# Patient Record
Sex: Female | Born: 1965 | Race: White | Hispanic: No | Marital: Married | Smoking: Former smoker
Health system: Southern US, Community
[De-identification: ages and names within clinical notes are randomized; demographics above are authoritative.]

## PROBLEM LIST (undated history)

## (undated) DIAGNOSIS — E785 Hyperlipidemia, unspecified: Secondary | ICD-10-CM

## (undated) DIAGNOSIS — M8580 Other specified disorders of bone density and structure, unspecified site: Secondary | ICD-10-CM

## (undated) DIAGNOSIS — K824 Cholesterolosis of gallbladder: Secondary | ICD-10-CM

## (undated) DIAGNOSIS — J309 Allergic rhinitis, unspecified: Secondary | ICD-10-CM

## (undated) DIAGNOSIS — E559 Vitamin D deficiency, unspecified: Secondary | ICD-10-CM

## (undated) HISTORY — DX: Cholesterolosis of gallbladder: K82.4

## (undated) HISTORY — DX: Other specified disorders of bone density and structure, unspecified site: M85.80

## (undated) HISTORY — PX: COLONOSCOPY: SHX174

## (undated) HISTORY — PX: WISDOM TOOTH EXTRACTION: SHX21

## (undated) HISTORY — DX: Allergic rhinitis, unspecified: J30.9

## (undated) HISTORY — PX: SKIN SURGERY: SHX2413

## (undated) HISTORY — DX: Hyperlipidemia, unspecified: E78.5

## (undated) HISTORY — DX: Vitamin D deficiency, unspecified: E55.9

---

## 1999-03-10 ENCOUNTER — Other Ambulatory Visit: Admission: RE | Admit: 1999-03-10 | Discharge: 1999-03-10 | Payer: Self-pay | Admitting: Obstetrics & Gynecology

## 2000-04-06 ENCOUNTER — Other Ambulatory Visit: Admission: RE | Admit: 2000-04-06 | Discharge: 2000-04-06 | Payer: Self-pay | Admitting: Obstetrics & Gynecology

## 2000-04-13 ENCOUNTER — Encounter: Payer: Self-pay | Admitting: Obstetrics & Gynecology

## 2000-04-13 ENCOUNTER — Ambulatory Visit (HOSPITAL_COMMUNITY): Admission: RE | Admit: 2000-04-13 | Discharge: 2000-04-13 | Payer: 59 | Admitting: Obstetrics & Gynecology

## 2001-04-28 ENCOUNTER — Other Ambulatory Visit: Admission: RE | Admit: 2001-04-28 | Discharge: 2001-04-28 | Payer: Self-pay | Admitting: Obstetrics & Gynecology

## 2002-10-12 ENCOUNTER — Other Ambulatory Visit: Admission: RE | Admit: 2002-10-12 | Discharge: 2002-10-12 | Payer: Self-pay | Admitting: Obstetrics & Gynecology

## 2004-03-26 ENCOUNTER — Other Ambulatory Visit: Admission: RE | Admit: 2004-03-26 | Discharge: 2004-03-26 | Payer: Self-pay | Admitting: Obstetrics & Gynecology

## 2004-06-20 ENCOUNTER — Encounter (INDEPENDENT_AMBULATORY_CARE_PROVIDER_SITE_OTHER): Payer: Self-pay | Admitting: Specialist

## 2004-06-20 ENCOUNTER — Ambulatory Visit (HOSPITAL_COMMUNITY): Admission: RE | Admit: 2004-06-20 | Discharge: 2004-06-20 | Payer: Self-pay | Admitting: Obstetrics & Gynecology

## 2005-04-27 HISTORY — PX: NOVASURE ABLATION: SHX5394

## 2005-05-12 ENCOUNTER — Other Ambulatory Visit: Admission: RE | Admit: 2005-05-12 | Discharge: 2005-05-12 | Payer: Self-pay | Admitting: Obstetrics & Gynecology

## 2005-06-03 ENCOUNTER — Ambulatory Visit (HOSPITAL_COMMUNITY): Admission: RE | Admit: 2005-06-03 | Discharge: 2005-06-03 | Payer: Self-pay | Admitting: Obstetrics & Gynecology

## 2005-06-10 ENCOUNTER — Encounter: Admission: RE | Admit: 2005-06-10 | Discharge: 2005-06-10 | Payer: Self-pay | Admitting: Obstetrics & Gynecology

## 2005-12-09 ENCOUNTER — Encounter: Admission: RE | Admit: 2005-12-09 | Discharge: 2005-12-09 | Payer: Self-pay | Admitting: Obstetrics & Gynecology

## 2006-06-11 ENCOUNTER — Encounter: Admission: RE | Admit: 2006-06-11 | Discharge: 2006-06-11 | Payer: Self-pay | Admitting: Obstetrics & Gynecology

## 2006-06-25 ENCOUNTER — Encounter: Admission: RE | Admit: 2006-06-25 | Discharge: 2006-06-25 | Payer: Self-pay | Admitting: Obstetrics & Gynecology

## 2007-06-14 ENCOUNTER — Encounter: Admission: RE | Admit: 2007-06-14 | Discharge: 2007-06-14 | Payer: Self-pay | Admitting: Obstetrics & Gynecology

## 2008-07-06 ENCOUNTER — Encounter: Admission: RE | Admit: 2008-07-06 | Discharge: 2008-07-06 | Payer: Self-pay | Admitting: Obstetrics & Gynecology

## 2009-06-13 ENCOUNTER — Ambulatory Visit (HOSPITAL_COMMUNITY): Admission: RE | Admit: 2009-06-13 | Discharge: 2009-06-13 | Payer: Self-pay | Admitting: Internal Medicine

## 2010-04-23 ENCOUNTER — Encounter
Admission: RE | Admit: 2010-04-23 | Discharge: 2010-04-23 | Payer: Self-pay | Source: Home / Self Care | Attending: Obstetrics & Gynecology | Admitting: Obstetrics & Gynecology

## 2010-04-27 DIAGNOSIS — K824 Cholesterolosis of gallbladder: Secondary | ICD-10-CM

## 2010-04-27 HISTORY — DX: Cholesterolosis of gallbladder: K82.4

## 2010-05-18 ENCOUNTER — Encounter: Payer: Self-pay | Admitting: Obstetrics & Gynecology

## 2010-05-21 ENCOUNTER — Ambulatory Visit (HOSPITAL_COMMUNITY)
Admission: RE | Admit: 2010-05-21 | Discharge: 2010-05-21 | Payer: Self-pay | Source: Home / Self Care | Attending: Internal Medicine | Admitting: Internal Medicine

## 2010-09-12 NOTE — Op Note (Signed)
NAMECOOKIE, PORE NO.:  [id number]   MEDICAL RECORD NO.:  [id number]          PATIENT TYPE:  AMB   LOCATION:  SDC                           FACILITY:  [facility]   PHYSICIAN:  [name], M.D.   DATE OF BIRTH:  September 28, 1965   DATE OF PROCEDURE:  06/20/2004  DATE OF DISCHARGE:                                 OPERATIVE REPORT   PREOPERATIVE DIAGNOSIS:  Menorrhagia, no obvious uterine pathology except  for slight increased size of uterus.   POSTOPERATIVE DIAGNOSIS:  Menorrhagia, no obvious uterine pathology except  for slight increased size of uterus.   OPERATION:  Hysteroscopy, D&C, and NovaSure endometrial ablation.   SURGEON:  [name], M.D.   ANESTHESIA:  General.   ESTIMATED INTRAOPERATIVE BLOOD LOSS:  Less than 10 cc.   FLUIDS:  Lactated ringers 100 cc.   INTRAOPERATIVE COMPLICATIONS:  None.   The patient is a 45 year old with longstanding menorrhagia who is now  admitted for hysteroscopy D&C and NovaSure ablation.  Her preoperative  laboratory studies included a normal prothrombin time and PTT.  She was  admitted on the morning of surgery.  She was given a gram of Rocephin IV.  She was brought to the operating room, placed under adequate general  anesthesia, placed in the dorsolithotomy position using the [place] stirrup  system.  A Betadine prep of the perineum and vagina was carried out in the  usual fashion.  Sterile drapes were applied.  A bivalved speculum was  introduced and the cervix visualized, grasped on the anterior lip of the  single-tooth tenaculum.  A paracervical block was placed using 10 cc 1%  Xylocaine.  Injections were made at 4 and 8 o'clock in the vaginal fornices.  The uterus was sounded to 9-cm hertz to 2.5-cm.  The cervix was dilated with  Hegar dilators to 23.  A 12-1/2 degree [facility] hysteroscope was introduced  using lactated Ringers as a distending medium.  Inspection of the cavity  revealed no apparent abnormality.   General curettage was carried out and  endometrial sample submitted for histologic examination.  The NovaSure  system was then used in the standard fashion.  Cervical length was 2.5-cm.  Uterine sounding was 9-cm.  Cavity width was 4.7-cm.  The ablation was  accomplished without  difficulty.  Re inspection revealed complete ablation of the endometrial  cavity.  Photographs were made before and after the ablation.  They are  retained in the office record.  The procedure at that point was terminated.  The patient was given Toradol 30 mg IV, 30 mg IM.  She was awake and taken  to the recovery room in good condition.      WRN/MEDQ  D:  06/20/2004  T:  06/20/2004  Job:  [id number]

## 2011-11-27 ENCOUNTER — Other Ambulatory Visit (HOSPITAL_COMMUNITY): Payer: Self-pay | Admitting: Internal Medicine

## 2011-11-27 DIAGNOSIS — K824 Cholesterolosis of gallbladder: Secondary | ICD-10-CM

## 2012-01-15 ENCOUNTER — Ambulatory Visit (HOSPITAL_COMMUNITY): Payer: Self-pay

## 2012-02-26 ENCOUNTER — Ambulatory Visit (HOSPITAL_COMMUNITY): Payer: 59

## 2012-02-26 ENCOUNTER — Ambulatory Visit (HOSPITAL_COMMUNITY)
Admission: RE | Admit: 2012-02-26 | Discharge: 2012-02-26 | Disposition: A | Discharge status: Home / Self Care | Payer: 59 | Source: Ambulatory Visit | Attending: Internal Medicine | Admitting: Internal Medicine

## 2012-02-26 DIAGNOSIS — K824 Cholesterolosis of gallbladder: Secondary | ICD-10-CM | POA: Insufficient documentation

## 2012-08-16 ENCOUNTER — Other Ambulatory Visit: Payer: Self-pay

## 2012-08-16 DIAGNOSIS — Z1231 Encounter for screening mammogram for malignant neoplasm of breast: Secondary | ICD-10-CM

## 2012-09-09 ENCOUNTER — Ambulatory Visit
Admission: RE | Admit: 2012-09-09 | Discharge: 2012-09-09 | Disposition: A | Discharge status: Home / Self Care | Payer: 59 | Source: Ambulatory Visit

## 2012-09-09 DIAGNOSIS — Z1231 Encounter for screening mammogram for malignant neoplasm of breast: Secondary | ICD-10-CM

## 2013-05-01 DIAGNOSIS — Z0000 Encounter for general adult medical examination without abnormal findings: Secondary | ICD-10-CM

## 2013-05-07 ENCOUNTER — Other Ambulatory Visit: Payer: Self-pay | Admitting: Internal Medicine

## 2013-05-07 DIAGNOSIS — R112 Nausea with vomiting, unspecified: Secondary | ICD-10-CM

## 2013-05-07 MED ORDER — PROCHLORPERAZINE 25 MG RE SUPP: RECTAL | Status: DC

## 2013-11-02 ENCOUNTER — Other Ambulatory Visit: Payer: Self-pay | Admitting: Emergency Medicine

## 2013-11-02 MED ORDER — DOXYCYCLINE HYCLATE 100 MG PO TABS: 100.0000 mg | ORAL_TABLET | Freq: Two times a day (BID) | ORAL | Status: DC

## 2013-11-02 MED ORDER — MUPIROCIN CALCIUM 2 % EX CREA: TOPICAL_CREAM | CUTANEOUS | Status: DC

## 2013-11-06 ENCOUNTER — Encounter: Payer: Self-pay | Admitting: Emergency Medicine

## 2013-11-06 ENCOUNTER — Encounter (INDEPENDENT_AMBULATORY_CARE_PROVIDER_SITE_OTHER): Payer: Self-pay

## 2013-11-06 ENCOUNTER — Ambulatory Visit (INDEPENDENT_AMBULATORY_CARE_PROVIDER_SITE_OTHER): Payer: BC Managed Care – PPO | Admitting: Emergency Medicine

## 2013-11-06 VITALS — BP 118/64 | HR 60 | Temp 98.2°F | Resp 18 | Ht 65.0 in | Wt 129.0 lb

## 2013-11-06 DIAGNOSIS — L03317 Cellulitis of buttock: Principal | ICD-10-CM

## 2013-11-06 DIAGNOSIS — L0231 Cutaneous abscess of buttock: Secondary | ICD-10-CM

## 2013-11-06 NOTE — Progress Notes (Signed)
Subjective:    Patient ID: Yvonne Bell, female    DOB: 1965/06/21, 48 y.o.   MRN: [id number]  HPI Comments: 48 yo WF was started on DOXY 11/02/13 for ? MRSA with boil on right buttocks. She notes daughter has same type of boils on buttocks and was started on antibiotics. She has been doing the epsom salt soaks and Muporicin BID nasally since 11/02/13 AD. SHe notes lancing of area on Saturday with yellow exudate expressed has allowed more drainage to occur and has helped with pain. She notes today pain is significantly improved from Saturday and area has gone from quarter to dime size with hardness. She notes area is still mildly red but that has also improved. She along with daughter noticed areas after swimming in pool.  Unable to apply to a note for 11/04/13 due to inability to get scheduled when office closed.  Patient was seen in office on 11/04/13  By Loree Fee and  area was prepped with alcohol and # 10 blade used to make small incision into erythematous area on right buttock. Blood/ yellow exudate expressed. Patient tolerated procedure with out concern. Area was bandage with guaze.     Medication List       This list is accurate as of: 11/06/13  2:33 PM.  Always use your most recent med list.               doxycycline 100 MG tablet  Commonly known as:  VIBRA-TABS  Take 1 tablet (100 mg total) by mouth 2 (two) times daily.     mupirocin cream 2 %  Commonly known as:  BACTROBAN  Apply to inside of each nostril BID x 5 days     prochlorperazine 25 MG suppository  Commonly known as:  COMPAZINE  Remove foil wrapper and insert 1 suppository rectally every 6 to 8 hours as needed for severe nausea and vomiting       Allergies  Allergen Reactions  . Influenza Vaccines       Review of Systems  Skin: Positive for wound.  All other systems reviewed and are negative. BP 118/64  Pulse 60  Temp(Src) 98.2 F (36.8 C) (Temporal)  Resp 18  Ht 5\' 5"  (1.651 m)  Wt 129 lb (58.514  kg)  BMI 21.47 kg/m2      Objective:   Physical Exam  Nursing note and vitals reviewed. Constitutional: She is oriented to person, place, and time. She appears well-developed and well-nourished.  HENT:  Head: Normocephalic and atraumatic.  Eyes: Conjunctivae are normal.  Neck: Normal range of motion.  Cardiovascular: Normal rate, regular rhythm, normal heart sounds and intact distal pulses.   Pulmonary/Chest: Effort normal and breath sounds normal.  Abdominal: Soft. Bowel sounds are normal. She exhibits no distension. There is no tenderness.  Musculoskeletal: Normal range of motion.  Neurological: She is alert and oriented to person, place, and time.  Skin: Skin is warm and dry. There is erythema.  Psychiatric: She has a normal mood and affect. Judgment normal.          Assessment & Plan:  Abscess right buttock- Continue current plan an add Chlorine bath MRSA prophylaxis plan, call with results.

## 2013-11-20 ENCOUNTER — Ambulatory Visit (INDEPENDENT_AMBULATORY_CARE_PROVIDER_SITE_OTHER): Payer: BC Managed Care – PPO | Admitting: Physician Assistant

## 2013-11-20 ENCOUNTER — Encounter: Payer: Self-pay | Admitting: Physician Assistant

## 2013-11-20 VITALS — BP 110/68 | HR 84 | Temp 97.9°F | Resp 16 | Wt 128.0 lb

## 2013-11-20 DIAGNOSIS — L0231 Cutaneous abscess of buttock: Secondary | ICD-10-CM

## 2013-11-20 DIAGNOSIS — L03317 Cellulitis of buttock: Principal | ICD-10-CM

## 2013-11-20 MED ORDER — SULFAMETHOXAZOLE-TMP DS 800-160 MG PO TABS: 1.0000 | ORAL_TABLET | Freq: Two times a day (BID) | ORAL | Status: DC

## 2013-11-20 MED ORDER — FLUCONAZOLE 150 MG PO TABS: 150.0000 mg | ORAL_TABLET | Freq: Every day | ORAL | Status: DC

## 2013-11-20 NOTE — Progress Notes (Signed)
Subjective:    Patient ID: Yvonne Bell, female    DOB: 11-01-1965, 48 y.o.   MRN: [id number]  HPI 48 y.o. female with recent abscess, treated with doxycycline. Noticed bump and pain on Thursday on right buttocks. It has not gotten as large as the last one but she is afraid of it becoming larger as before. She has started new gym in the last month, denies any systemic symptoms.    Review of Systems  Constitutional: Negative.   HENT: Negative.   Respiratory: Negative.   Cardiovascular: Negative.   Gastrointestinal: Negative.   Genitourinary: Negative.   Musculoskeletal: Negative.   Skin: Positive for rash (eczema rash occ eyebrows, ears, and right buttocks) and wound (recurrent abscess ). Negative for color change and pallor.  Neurological: Negative.        Objective:   Physical Exam  Nursing note and vitals reviewed. Constitutional: She is oriented to person, place, and time. She appears well-developed and well-nourished.  HENT:  Head: Normocephalic and atraumatic.  Eyes: Conjunctivae are normal.  Neck: Normal range of motion.  Cardiovascular: Normal rate, regular rhythm, normal heart sounds and intact distal pulses.   Pulmonary/Chest: Effort normal and breath sounds normal.  Abdominal: Soft. Bowel sounds are normal. She exhibits no distension. There is no tenderness.  Musculoskeletal: Normal range of motion.  Neurological: She is alert and oriented to person, place, and time.  Skin: Skin is warm and dry. There is erythema (indurated, tender area on right medial buttocks, non fluctuant, 1.5x1.5cm).  Psychiatric: She has a normal mood and affect. Judgment normal.       Assessment & Plan:  Abscess- recurrent- bactrim, sitz bathes, warm wet compresses, exfoliate, shower after gym Eczema/dermatitis- can use baby shampoo, okay to use hydrocortisone OTC

## 2014-01-23 ENCOUNTER — Ambulatory Visit (INDEPENDENT_AMBULATORY_CARE_PROVIDER_SITE_OTHER): Payer: BC Managed Care – PPO | Admitting: Physician Assistant

## 2014-01-23 ENCOUNTER — Encounter: Payer: Self-pay | Admitting: Physician Assistant

## 2014-01-23 VITALS — BP 120/72 | HR 64 | Temp 97.7°F | Resp 16 | Ht 65.0 in | Wt 128.0 lb

## 2014-01-23 DIAGNOSIS — L0390 Cellulitis, unspecified: Principal | ICD-10-CM

## 2014-01-23 DIAGNOSIS — L0291 Cutaneous abscess, unspecified: Secondary | ICD-10-CM

## 2014-01-23 MED ORDER — SULFAMETHOXAZOLE-TMP DS 800-160 MG PO TABS: 1.0000 | ORAL_TABLET | Freq: Two times a day (BID) | ORAL | Status: DC

## 2014-01-23 NOTE — Progress Notes (Signed)
Subjective:    Patient ID: Yvonne Bell, female    DOB: May 27, 1965, 48 y.o.   MRN: [id number]  HPI 48 y.o. female with recent abscess, she was treated with bactrim and it went away when she noticed on her left buttocks a bump and tenderness. She took 1 of a left over ABX which helped some, she has not done compresses. Denies any systemic symptoms. She is very concerned as to why she is having the new abscesses, she uses a heating pad at night but turns it off before bed, she takes bubble bathes daily but has not changed soap. She is working out more and has started to have some hot flashes at night waking up.     Review of Systems  Constitutional: Negative.   HENT: Negative.   Respiratory: Negative.   Cardiovascular: Negative.   Gastrointestinal: Negative.   Genitourinary: Negative.   Musculoskeletal: Negative.   Skin: Positive for rash (left buttocks) and wound (recurrent abscess ). Negative for color change and pallor.  Neurological: Negative.        Objective:   Physical Exam  Nursing note and vitals reviewed. Constitutional: She is oriented to person, place, and time. She appears well-developed and well-nourished.  HENT:  Head: Normocephalic and atraumatic.  Eyes: Conjunctivae are normal.  Neck: Normal range of motion.  Cardiovascular: Normal rate, regular rhythm, normal heart sounds and intact distal pulses.   Pulmonary/Chest: Effort normal and breath sounds normal.  Abdominal: Soft. Bowel sounds are normal. She exhibits no distension. There is no tenderness.  Musculoskeletal: Normal range of motion.  Neurological: She is alert and oriented to person, place, and time.  Skin: Skin is warm and dry. There is erythema (indurated, tender area on left lateral buttocks, non fluctuant, 1x1cm).  Psychiatric: She has a normal mood and affect. Judgment normal.       Assessment & Plan:  Abscess- recurrent- bactrim, sitz bathes, warm wet compresses, exfoliate, shower after gym,  cotton undies.

## 2014-01-23 NOTE — Patient Instructions (Signed)

## 2014-04-06 ENCOUNTER — Encounter: Payer: Self-pay | Admitting: Emergency Medicine

## 2014-05-09 ENCOUNTER — Encounter: Payer: Self-pay | Admitting: Emergency Medicine

## 2014-05-15 ENCOUNTER — Ambulatory Visit (INDEPENDENT_AMBULATORY_CARE_PROVIDER_SITE_OTHER): Payer: BLUE CROSS/BLUE SHIELD | Admitting: Physician Assistant

## 2014-05-15 ENCOUNTER — Encounter: Payer: Self-pay | Admitting: Physician Assistant

## 2014-05-15 VITALS — BP 140/84 | HR 80 | Temp 98.4°F | Resp 18 | Ht 64.75 in

## 2014-05-15 DIAGNOSIS — L02419 Cutaneous abscess of limb, unspecified: Secondary | ICD-10-CM

## 2014-05-15 DIAGNOSIS — L03119 Cellulitis of unspecified part of limb: Secondary | ICD-10-CM

## 2014-05-15 MED ORDER — SULFAMETHOXAZOLE-TRIMETHOPRIM 800-160 MG PO TABS: 1.0000 | ORAL_TABLET | Freq: Two times a day (BID) | ORAL | Status: DC

## 2014-05-15 NOTE — Progress Notes (Signed)
  HPI  A Caucasian 49 y.o.female presents to the office today due to possible MRSA on right shin that started Saturday.  Her sxs are getting worse.  She took two pills of left over Bactrim from last cellulitis.  Patient was seen in July 2015 to September 2015 for cellulitis and abscess.  Patient's family has history of MRSA, but patient has never been tested herself.  She reports leg swelling and redness.  No pain.  She denies fever, chills sweats, fatigue, SOB, CP, abdominal pain ,nausea, vomiting, diarrhea, constipation, dizziness, lightheadedness and headaches.  Patient does get yeast infections from antibiotics and does have diflucan at home if needed.  Review of Systems  Constitutional: Negative.  Negative for fever, chills, malaise/fatigue and diaphoresis.  HENT: Negative.  Negative for congestion, ear discharge, ear pain and sore throat.   Eyes: Negative.   Respiratory: Negative.  Negative for cough, sputum production, shortness of breath and wheezing.   Cardiovascular: Negative.  Negative for chest pain.  Gastrointestinal: Negative.  Negative for nausea, vomiting, abdominal pain, diarrhea and constipation.  Genitourinary: Negative.  Negative for dysuria, urgency and frequency.  Musculoskeletal: Negative.   Skin:       Erythematous and edematous spot on right leg  Neurological: Negative.  Negative for headaches.   Past Medical History- No past medical history on file. Medications-  Current Outpatient Prescriptions on File Prior to Visit  Medication Sig Dispense Refill  . Cholecalciferol (VITAMIN D PO) Take 15,000 Int'l Units by mouth.    . sulfamethoxazole-trimethoprim (BACTRIM DS) 800-160 MG per tablet Take 1 tablet by mouth 2 (two) times daily. 20 tablet 0   No current facility-administered medications on file prior to visit.   Allergies-  Allergies  Allergen Reactions  . Influenza Vaccines    Physical Exam BP 140/84 mmHg  Pulse 80  Temp(Src) 98.4 F (36.9 C) (Temporal)   Resp 18  Ht 5' 4.75" (1.645 m) Wt Readings from Last 3 Encounters:  01/23/14 128 lb (58.06 kg)  11/20/13 128 lb (58.06 kg)  11/06/13 129 lb (58.514 kg)  Vitals Reviewed. General Appearance: Well nourished, in no apparent distress and had pleasant demeanor. Eyes:  PERRLA. EOMI. Conjunctiva is pink without edema, erythema or yellowing.  No scleral icterus. Neck: Normal range of motion. Respiratory: CTAB,  r/r/w or stridor. No increased effort of breathing. Cardio: RRR.   m/r/g.  S1S2nl.   Extremities:  C/C/E in upper and lower extremities. Pulses B/L +2  Musculoskeletal: Normal range of motion. Skin: Warm, dry, intact without rashes, lesions, ecchymosis, yellowing, cyanosis.  5cm erythematous, edematous, non-fluctuant and warm to touch spot on right proximal anterior medial tibia.  There is central scab on red spot, but no drainage to get culture.  Neuro: Alert and oriented X3, cooperative.  Mood and affect appropriate to situation.  CN II-XII grossly intact. Normal gait.   Psych: Insight and Judgment appropriate.   Assessment and Plan 1. Cellulitis and abscess of leg - sulfamethoxazole-trimethoprim (BACTRIM DS,SEPTRA DS) 800-160 MG per tablet; Take 1 tablet by mouth 2 (two) times daily. For 14 days  Dispense: 28 tablet; Refill: 0 Told patient to let me know if the swelling starts to come to a point.  Might have to I&D.  Discussed medication effects and SE's.  Pt agreed to treatment plan. Please follow up in 1 week.  Aswad Wandrey, Stephani Police, PA-C 4:43 PM [facility]

## 2014-05-15 NOTE — Patient Instructions (Addendum)
-Take Bactrim as prescribed with food. Please drink water to stay hydrated.  Please follow up in 1 week.   MRSA Infection MRSA stands for methicillin-resistant Staphylococcus aureus. This type of infection is caused by Staphylococcus aureus bacteria that are no longer affected by the medicines used to kill them (drug resistant). Staphylococcus (staph) bacteria are normally found on the skin or in the nose of healthy people. In most cases, these bacteria do not cause infection. But if these resistant bacteria enter your body through a cut or sore, they can cause a serious infection on your skin or in other parts of your body. There is a slight chance that the staph on your skin or in your nose is MRSA. There are two types of MRSA infections:  Hospital-acquired MRSA is bacteria that you get in the hospital.  Community-acquired MRSA is bacteria that you get somewhere other than in a hospital. RISK FACTORS Hospital-acquired MRSA is more common. You could be at risk for this infection if you are in the hospital and you:  Have surgery or a procedure.  Have an IV access or a catheter tube placed in your body.  Have weak resistance to germs (weakened immune system).  Are elderly.  Are on kidney dialysis. You could be at risk for community-acquired MRSA if you have a break in your skin and come into contact with MRSA. This may happen if you:  Play sports where there is skin-to-skin contact.  Live in a crowded setting, like a dormitory or a [employer]  Share towels, razors, or sports equipment with other people. SYMPTOMS  Symptoms of hospital-acquired MRSA depend on where MRSA has spread. Symptoms may include:  Wound infection.  Skin infection.  Rash.  Pneumonia.  Fever and chills.  Difficulty breathing.  Chest pain. Community-acquired MRSA is most likely to start as a scratch or cut that becomes infected. Symptoms may include:  A pus-filled pimple.  A boil on your  skin.  Pus draining from your skin.  A sore (abscess) under your skin or somewhere in your body.  Fever with or without chills. DIAGNOSIS  The diagnosis of MRSA is made by taking a sample from an infected area and sending it to a lab for testing. A lab technician can grow (culture) MRSA and check it under a microscope. The cultured MRSA can be tested to see which type of antibiotic medicine will work to treat it. Newer tests can identify MRSA more quickly by testing bacteria samples for MRSA genes. Your health care provider can diagnose MRSA using samples from:   Cuts or wounds in infected areas.  Nasal swabs.  Saliva or cough specimens from deep in the lungs (sputum).  Urine.  Blood. You may also have:  Imaging studies (such as X-ray or MRI) to check if the infection has spread to the lungs, bones, or joints.  A culture and sensitivity test of blood or fluids from inside the joints. TREATMENT  Treatment depends on how severe, deep, or extensive the infection is. Very bad infections may require a hospital stay.  Some skin infections, such as a small boil or sore (abscess), may be treated by draining pus from the site of the infection.  More extensive surgery to drain pus may be necessary for deeper or more widespread soft tissue infections.  You may then have to take antibiotic medicine given by mouth or through a vein. You may start antibiotic treatment right away or after testing can be done to see what  antibiotic medicine should be used. HOME CARE INSTRUCTIONS   Take your antibiotics as directed by your health care provider. Take the medicine as prescribed until it is finished.  Avoid close contact with those around you as much as possible. Do not use towels, razors, toothbrushes, bedding, or other items that will be used by others.  Wash your hands frequently for 15 seconds with soap and water. Dry your hands with a clean or disposable towel.  When you are not able to wash  your hands, use hand sanitizer that is more than 60 percent alcohol.  Wash towels, sheets, or clothes in the washing machine with detergent and hot water. Dry them in a hot dryer.  Follow your health care provider's instructions for wound care. Wash your hands before and after changing your bandages.  Always shower after exercising.  Keep all cuts and scrapes clean and covered with a bandage.  Be sure to tell all your health care providers that you have MRSA so they are aware of your infection. SEEK MEDICAL CARE IF:  You have a cut, scrape, pimple, or boil that becomes red, swollen, or painful or has pus in it.  You have pus draining from your skin.  You have an abscess under your skin or somewhere in your body. SEEK IMMEDIATE MEDICAL CARE IF:   You have symptoms of a skin infection with a fever or chills.  You have trouble breathing.  You have chest pain.  You have a skin wound and you become nauseous or start vomiting. MAKE SURE YOU:  Understand these instructions.  Will watch your condition.  Will get help right away if you are not doing well or get worse. Document Released: 04/13/2005 Document Revised: 04/18/2013 Document Reviewed: 02/03/2013 [facility] Patient Information 2015 [place], [place]. This information is not intended to replace advice given to you by your health care provider. Make sure you discuss any questions you have with your health care provider.

## 2014-05-22 ENCOUNTER — Encounter: Payer: Self-pay | Admitting: Physician Assistant

## 2014-05-22 ENCOUNTER — Ambulatory Visit (INDEPENDENT_AMBULATORY_CARE_PROVIDER_SITE_OTHER): Payer: BLUE CROSS/BLUE SHIELD | Admitting: Physician Assistant

## 2014-05-22 VITALS — BP 132/80 | HR 74 | Temp 98.2°F | Resp 18

## 2014-05-22 DIAGNOSIS — L03119 Cellulitis of unspecified part of limb: Secondary | ICD-10-CM

## 2014-05-22 DIAGNOSIS — L02419 Cutaneous abscess of limb, unspecified: Secondary | ICD-10-CM

## 2014-05-22 NOTE — Patient Instructions (Signed)
-  Continue Bactrim as prescribed. -Please change dressing every day.   Please use neosporin or Bacitracin over the counter.   Incision and Drainage Care After Refer to this sheet in the next few weeks. These instructions provide you with information on caring for yourself after your procedure. Your caregiver may also give you more specific instructions. Your treatment has been planned according to current medical practices, but problems sometimes occur. Call your caregiver if you have any problems or questions after your procedure. HOME CARE INSTRUCTIONS   If antibiotic medicine is given, take it as directed. Finish it even if you start to feel better.  Only take over-the-counter or prescription medicines for pain, discomfort, or fever as directed by your caregiver.  Keep all follow-up appointments as directed by your caregiver.  Change any bandages (dressings) as directed by your caregiver. Replace old dressings with clean dressings.  Wash your hands before and after caring for your wound. You will receive specific instructions for cleansing and caring for your wound.  SEEK MEDICAL CARE IF:   You have increased pain, swelling, or redness around the wound.  You have increased drainage, smell, or bleeding from the wound.  You have muscle aches, chills, or you feel generally sick.  You have a fever. MAKE SURE YOU:   Understand these instructions.  Will watch your condition.  Will get help right away if you are not doing well or get worse. Document Released: 07/06/2011 Document Reviewed: 07/06/2011 [facility] Patient Information 2015 [place]. This information is not intended to replace advice given to you by your health care provider. Make sure you discuss any questions you have with your health care provider.

## 2014-05-22 NOTE — Progress Notes (Signed)
HPI  A Caucasian 49 y.o.female presents to the office today due to follow up for cellulitis on right shin that started 05/12/14 (11 days ago).  Her sxs are getting better since being on Bactrim.  Patient is on day 8 of antibiotic out of 14 days.  Patient was seen in July 2015 to September 2015 for cellulitis and abscess.  Patient's family has history of MRSA, but patient has never been tested herself.  She is taking baths in tub, but is using Bleach to clean tub before each use.  She reports leg swelling and redness.  No pain.  She denies fever, chills sweats, fatigue, SOB, CP, abdominal pain ,nausea, vomiting, diarrhea, constipation, dizziness, lightheadedness and headaches.  Patient does get yeast infections from antibiotics and does have diflucan at home if needed.  Patient gave verbal consent for me to talk to Dr. Melford Aase if needed.   Review of Systems  All other systems reviewed and are negative.  Past Medical History- No past medical history on file.   Medications-  Current Outpatient Prescriptions on File Prior to Visit  Medication Sig Dispense Refill  . Cholecalciferol (VITAMIN D PO) Take 15,000 Int'l Units by mouth.    . sulfamethoxazole-trimethoprim (BACTRIM DS,SEPTRA DS) 800-160 MG per tablet Take 1 tablet by mouth 2 (two) times daily. For 14 days 28 tablet 0   No current facility-administered medications on file prior to visit.   Allergies-  Allergies  Allergen Reactions  . Influenza Vaccines    Physical Exam BP 132/80 mmHg  Pulse 74  Temp(Src) 98.2 F (36.8 C) (Temporal)  Resp 18 Wt Readings from Last 3 Encounters:  01/23/14 128 lb (58.06 kg)  11/20/13 128 lb (58.06 kg)  11/06/13 129 lb (58.514 kg)  Vitals Reviewed. General Appearance: Well nourished, in no apparent distress and had pleasant demeanor. Eyes:  PERRLA. EOMI. Conjunctiva is pink without edema, erythema or yellowing.  No scleral icterus. Neck: Normal range of motion. Respiratory: CTAB,  r/r/w or stridor.  No increased effort of breathing. Cardio: RRR.   m/r/g.  S1S2nl.   Extremities:  C/C/E in upper and lower extremities. Pulses B/L +2  Musculoskeletal: Normal range of motion. Skin: Warm, dry, intact without rashes, lesions, ecchymosis, yellowing, cyanosis.  3cm erythematous, edematous, fluctuant and not warm to touch spot on right proximal anterior medial tibia.  There is central scab on red spot, but no drainage.  Poked central fluctuant area with sterile needle and had purulent drainage, so then decided to I&D. Neuro: Alert and oriented X3, cooperative.  Mood and affect appropriate to situation.  CN II-XII grossly intact. Normal gait.   Psych: Insight and Judgment appropriate.   Incision and Drainage Procedure Note Pre-operative Diagnosis: Localized abscess of right proximal anterior medial tibia that is fluctuant, erythematous, painful, swollen and not resolving spontaneously with Bactrim. Post-operative Diagnosis: same Indications: Abscess (purulent drainage) and cellulitis. Anesthesia: Marcaine 0.5% with Epinephrine. Procedure Details  The procedure, risks and complications (cellulitis, recollection of pus, bacteremia, septicemia) have been discussed in detail (including, but not limited to airway compromise, infection, bleeding, scarring) with the patient, and the patient gave verbal consent to the procedure.  The skin was sterilely prepped and draped with chucks around the affected area in the usual fashion. After adequate local anesthesia, I&D with a #11 blade was performed on the right, medial proximal anterior tibia.  Incision was 1cm long.  Purulent drainage: present.  Wound culture obtained. The patient was observed until stable. Findings: Purulent material- liquid and solid  EBL: 2-3 cc's Drains: None Dressing: No packing used.  Two 4x4 gauze pads were folded twice and placed over incision site with Bacitracin ointment.  Then one Ace bandage was placed on wound to hold gauze pads  in place.  Condition: Tolerated procedure well and Stable Complications: none.  Assessment and Plan 1. Cellulitis and abscess of leg Continue Bactrim as prescribed. Will call patient with wound culture results. Apply hot compresses frequently to promote drainage. Please change dressing daily and gave patient aftercare instructions.  Discussed medication effects and SE's.  Pt agreed to treatment plan. Please follow up in 2 days for recheck.  Phu Record, Stephani Police, PA-C 4:29 PM [facility]

## 2014-05-24 ENCOUNTER — Encounter: Payer: Self-pay | Admitting: Physician Assistant

## 2014-05-24 ENCOUNTER — Ambulatory Visit: Payer: Self-pay | Admitting: Physician Assistant

## 2014-05-24 DIAGNOSIS — L02419 Cutaneous abscess of limb, unspecified: Secondary | ICD-10-CM

## 2014-05-24 DIAGNOSIS — L03119 Cellulitis of unspecified part of limb: Principal | ICD-10-CM

## 2014-05-24 NOTE — Progress Notes (Signed)
  HPI  A Caucasian 49 y.o.female presents to the office today due to follow up for cellulitis on right shin that started 05/12/14 (13 days ago).  Her sxs are getting better since being on Bactrim.  Patient is on day 10 of antibiotic out of 14 days.  Patient had I&D done 2 days ago without complications.  Patient is cleaning the site with soap and water during shower, and then covering with large band-aid with neosporin.  Still waiting for wound culture results that were obtained during I&D procedure.  Patient was seen in July 2015 to September 2015 for cellulitis and abscess.  Patient's family has history of MRSA, but patient has never been tested herself.  She is taking baths in tub, but is using Bleach to clean tub before each use.  She reports leg swelling and redness.  No pain.  She denies fever, chills sweats, fatigue, SOB, CP, abdominal pain ,nausea, vomiting, diarrhea, constipation, dizziness, lightheadedness and headaches.  Patient does get yeast infections from antibiotics and does have diflucan at home if needed.  Patient gave verbal consent for me to talk to Dr. Melford Aase if needed.   Review of Systems  All other systems reviewed and are negative.  Past Medical History- No past medical history on file.   Medications-  Current Outpatient Prescriptions on File Prior to Visit  Medication Sig Dispense Refill  . Cholecalciferol (VITAMIN D PO) Take 15,000 Int'l Units by mouth.    . sulfamethoxazole-trimethoprim (BACTRIM DS,SEPTRA DS) 800-160 MG per tablet Take 1 tablet by mouth 2 (two) times daily. For 14 days 28 tablet 0   No current facility-administered medications on file prior to visit.   Allergies-  Allergies  Allergen Reactions  . Influenza Vaccines    Physical Exam There were no vitals taken for this visit. Wt Readings from Last 3 Encounters:  01/23/14 128 lb (58.06 kg)  11/20/13 128 lb (58.06 kg)  11/06/13 129 lb (58.514 kg)  Vitals Reviewed. General Appearance: Well  nourished, in no apparent distress and had pleasant demeanor. Extremities:  C/C/E in upper and lower extremities. Pulses B/L +2  Musculoskeletal: Normal range of motion. Skin: Warm, dry, intact without rashes, ecchymosis, yellowing, cyanosis.  3cm erythematous, edematous, non-fluctuant and not warm to touch cellulitis with 0.5cm horizontal lesion on right proximal anterior medial tibia.  There was small amount of purulent drainage.   Neuro: Alert and oriented X3, cooperative.  Mood and affect appropriate to situation.  CN II-XII grossly intact. Normal gait.   Psych: Insight and Judgment appropriate.   Assessment and Plan 1. Cellulitis and abscess of leg Continue Bactrim as prescribed. Will call patient with wound culture results. Apply hot compresses frequently to promote drainage. Please change dressing daily and gave patient aftercare instructions (can shower, but no baths).  Discussed medication effects and SE's.  Pt agreed to treatment plan. Please follow up in 4 days for recheck.  Princess Karnes, Stephani Police, PA-C 8:25 PM [facility]

## 2014-05-25 LAB — WOUND CULTURE
GRAM STAIN: NONE SEEN
Gram Stain: NONE SEEN

## 2014-05-28 ENCOUNTER — Other Ambulatory Visit: Payer: Self-pay | Admitting: Internal Medicine

## 2014-05-28 MED ORDER — LEVOFLOXACIN 500 MG PO TABS: ORAL_TABLET | ORAL | Status: DC

## 2014-05-28 NOTE — Progress Notes (Signed)
S:   Patient is seen in f/u today completing a course of Septra for a MERSA (+) abscess of right shin and has some persistent tenderness around the wound site.  O:   ~ 10 mm superficial ulceration with surrounding erythema w/o induration of fluctuance or satellite lesions or the right shin. No signs of lymphangitis.  A:   s/p I&D MERSA abscess of rt shin  P:   per sensitivities will renew with Levaquin 50 mg  #20 - 1 bid x 5 days, then qd x 10 days.

## 2014-06-08 ENCOUNTER — Ambulatory Visit (INDEPENDENT_AMBULATORY_CARE_PROVIDER_SITE_OTHER): Payer: BLUE CROSS/BLUE SHIELD | Admitting: Emergency Medicine

## 2014-06-08 ENCOUNTER — Encounter: Payer: Self-pay | Admitting: Emergency Medicine

## 2014-06-08 VITALS — BP 134/70 | HR 60 | Temp 98.2°F | Resp 18 | Ht 64.5 in | Wt 130.0 lb

## 2014-06-08 DIAGNOSIS — Z79899 Other long term (current) drug therapy: Secondary | ICD-10-CM

## 2014-06-08 DIAGNOSIS — Z1212 Encounter for screening for malignant neoplasm of rectum: Secondary | ICD-10-CM

## 2014-06-08 DIAGNOSIS — M8580 Other specified disorders of bone density and structure, unspecified site: Secondary | ICD-10-CM

## 2014-06-08 DIAGNOSIS — Z0000 Encounter for general adult medical examination without abnormal findings: Secondary | ICD-10-CM

## 2014-06-08 DIAGNOSIS — R239 Unspecified skin changes: Secondary | ICD-10-CM

## 2014-06-08 DIAGNOSIS — R05 Cough: Secondary | ICD-10-CM

## 2014-06-08 DIAGNOSIS — E782 Mixed hyperlipidemia: Secondary | ICD-10-CM

## 2014-06-08 DIAGNOSIS — R932 Abnormal findings on diagnostic imaging of liver and biliary tract: Secondary | ICD-10-CM

## 2014-06-08 DIAGNOSIS — Z0001 Encounter for general adult medical examination with abnormal findings: Secondary | ICD-10-CM

## 2014-06-08 DIAGNOSIS — Z111 Encounter for screening for respiratory tuberculosis: Secondary | ICD-10-CM

## 2014-06-08 DIAGNOSIS — R6889 Other general symptoms and signs: Secondary | ICD-10-CM

## 2014-06-08 DIAGNOSIS — I10 Essential (primary) hypertension: Secondary | ICD-10-CM

## 2014-06-08 DIAGNOSIS — R059 Cough, unspecified: Secondary | ICD-10-CM

## 2014-06-08 DIAGNOSIS — E559 Vitamin D deficiency, unspecified: Secondary | ICD-10-CM

## 2014-06-08 LAB — CBC WITH DIFFERENTIAL/PLATELET
BASOS PCT: 1 % (ref 0–1)
Basophils Absolute: 0.1 10*3/uL (ref 0.0–0.1)
EOS ABS: 0.1 10*3/uL (ref 0.0–0.7)
Eosinophils Relative: 2 % (ref 0–5)
HCT: 45.2 % (ref 36.0–46.0)
HEMOGLOBIN: 15.2 g/dL — AB (ref 12.0–15.0)
LYMPHS ABS: 1.7 10*3/uL (ref 0.7–4.0)
Lymphocytes Relative: 23 % (ref 12–46)
MCH: 32.1 pg (ref 26.0–34.0)
MCHC: 33.6 g/dL (ref 30.0–36.0)
MCV: 95.6 fL (ref 78.0–100.0)
MPV: 10.1 fL (ref 8.6–12.4)
Monocytes Absolute: 0.7 10*3/uL (ref 0.1–1.0)
Monocytes Relative: 10 % (ref 3–12)
NEUTROS PCT: 64 % (ref 43–77)
Neutro Abs: 4.7 10*3/uL (ref 1.7–7.7)
Platelets: 311 10*3/uL (ref 150–400)
RBC: 4.73 MIL/uL (ref 3.87–5.11)
RDW: 13.6 % (ref 11.5–15.5)
WBC: 7.3 10*3/uL (ref 4.0–10.5)

## 2014-06-08 MED ORDER — MUPIROCIN CALCIUM 2 % EX CREA: TOPICAL_CREAM | CUTANEOUS | Status: DC

## 2014-06-08 NOTE — Patient Instructions (Addendum)
Melanoma Melanoma is a form of skin cancer that begins in melanocytes. Melanocytes are the skin cells that produce pigment. Melanoma starts as a mole on the skin and can spread to other parts of the body. If found early, many cases of melanoma are curable.  CAUSES  The exact cause is unknown.  RISK FACTORS  Spending a lot of time in the sun, under a sunlamp, or in a tanning booth.  Having sunburn that blisters. The more blistering sunburns you have, the higher your risk of melanoma.  Spending time in parts of the world with more intense sunlight.  Living in a hot, sunny climate.  Having fair skin that does not tan easily.  Having had melanoma before.  Having a family history of melanoma.  Having more than 100 skin moles. SIGNS AND SYMPTOMS   ABCDE changes in a mole. ABCDE stands for:   Asymmetry. This means the mole has an irregular shape. It is not round or oval.  Border. This means the mole has an irregular or bumpy border.  Color. This means the mole has multiple colors in it, including brown, black, blue, red, or tan.  Diameter. This means the mole is more than 0.2 in (6 mm) across.  Evolving. This refers to any unusual changes or symptoms in the mole, such as pain, itching, stinging, sensitivity, or bleeding.  A new mole.  Swollen lymph nodes.  Shortness of breath.  Bone pain.  Headache.  Seizures.  Visual problems. DIAGNOSIS  Your health care provider will take a tissue sample from the mole to examine under a microscope (biopsy). The biopsy will reveal whether melanoma has spread to deeper layers of the skin. Your health care provider will also order tests, including:  Blood tests.  Chest X-rays.  A CT scan.  A bone scan. TREATMENT  You will have surgery to remove the cancer. Your lymph nodes may also be removed during the surgery. If melanoma has spread to other organs, such as the liver, lungs, bone, or brain, you will need additional treatment.   PREVENTION  Melanoma may come back (recur) after it has been treated. Your risk of recurrence is higher if you had thick or ulcerated tumors or patches of tumors. Generally, the more advanced your melanoma, the more likely it will recur. You can help prevent melanoma from recurring by staying out of the sun, especially during peak midafternoon hours. When you are outdoors or in the sun:  Wear long sleeves, a hat, and sunglasses that block UV light when possible.  Apply a sunscreen with an SPF of 30 or higher regularly. Take these precautions on cloudy days and in the winter, even if you will be outdoors for only a short period of time. SEEK MEDICAL CARE IF:  You notice any new growths or changes in your skin.  You have had melanoma removed and you notice a new growth in the same location. Document Released: 04/13/2005 Document Revised: 08/28/2013 Document Reviewed: 06/07/2013 [facility] Patient Information 2015 [place], [place]. This information is not intended to replace advice given to you by your health care provider. Make sure you discuss any questions you have with your health care provider.  Tuberculin Skin Test The PPD skin test is a method used to help with the diagnosis of a disease called tuberculosis (TB). HOW THE TEST IS DONE  The test site (usually the forearm) is cleansed. The PPD extract is then injected under the top layer of skin, causing a blister to form on  the skin. The reaction will take 48 - 72 hours to develop. You must return to your health care provider within that time to have the area checked. This will determine whether you have had a significant reaction to the PPD test. A reaction is measured in millimeters of hard swelling (induration) at the site. PREPARATION FOR TEST  There is no special preparation for this test. People with a skin rash or other skin irritations on their arms may need to have the test performed at a different spot on the body. Tell your health  care provider if you have ever had a positive PPD skin test. If so, you should not have a repeat PPD test. Tell your doctor if you have a medical condition or if you take certain drugs, such as steroids, that can affect your immune system. These situations may lead to inaccurate test results. NORMAL FINDINGS A negative reaction (no induration) or a level of hard swelling that falls below a certain cutoff may mean that a person has not been infected with the bacteria that cause TB. There are different cutoffs for children, people with HIV, and other risk groups. Unfortunately, this is not a perfect test, and up to 20% of people infected with tuberculosis may not have a reaction on the PPD skin test. In addition, certain conditions that affect the immune system (cancer, recent chemotherapy, late-stage AIDS) may cause a false-negative test result.  The reaction will take 48 - 72 hours to develop. You must return to your health care provider within that time to have the area checked. Follow your caregiver's instructions as to where and when to report for this to be done. Ranges for normal findings may vary among different laboratories and hospitals. You should always check with your doctor after having lab work or other tests done to discuss the meaning of your test results and whether your values are considered within normal limits. WHAT ABNORMAL RESULTS MEAN  The results of the test depend on the size of the skin reaction and on the person being tested.  A small reaction (5 mm of hard swelling at the site) is considered to be positive in people who have HIV, who are taking steroid therapy, or who have been in close contact with a person who has active tuberculosis. Larger reactions (greater than or equal to 10 mm) are considered positive in people with diabetes or kidney failure, and in health care workers, among others. In people with no known risks for tuberculosis, a positive reaction requires 15 mm or  more of hard swelling at the site. RISKS AND COMPLICATIONS There is a very small risk of severe redness and swelling of the arm in people who have had a previous positive PPD test and who have the test again. There also have been a few rare cases of this reaction in people who have not been tested before. CONSIDERATIONS  A positive skin test does not necessarily mean that a person has active tuberculosis. More tests will be done to check whether active disease is present. Many people who were born outside the [place] may have had a vaccine called "BCG," which can lead to a false-positive test result. MEANING OF TEST  Your caregiver will go over the test results with you and discuss the importance and meaning of your results, as well as treatment options and the need for additional tests if necessary. OBTAINING THE TEST RESULTS It is your responsibility to obtain your test results. Ask the lab  or department performing the test when and how you will get your results. Document Released: 01/21/2005 Document Revised: 07/06/2011 Document Reviewed: 07/21/2013 [facility] Patient Information 2015 [place], [place]. This information is not intended to replace advice given to you by your health care provider. Make sure you discuss any questions you have with your health care provider.

## 2014-06-09 LAB — GAMMA GT: GGT: 37 U/L (ref 7–51)

## 2014-06-09 LAB — HEMOGLOBIN A1C
Hgb A1c MFr Bld: 5.2 % (ref <5.7)
Mean Plasma Glucose: 103 mg/dL (ref <117)

## 2014-06-09 LAB — LIPID PANEL
Cholesterol: 226 mg/dL — ABNORMAL HIGH (ref 0–200)
HDL: 95 mg/dL (ref >39)
LDL CALC: 119 mg/dL — AB (ref 0–99)
TRIGLYCERIDES: 61 mg/dL (ref <150)
Total CHOL/HDL Ratio: 2.4 Ratio
VLDL: 12 mg/dL (ref 0–40)

## 2014-06-09 LAB — URINALYSIS, ROUTINE W REFLEX MICROSCOPIC
BILIRUBIN URINE: NEGATIVE
Glucose, UA: NEGATIVE mg/dL
Ketones, ur: NEGATIVE mg/dL
LEUKOCYTES UA: NEGATIVE
Nitrite: NEGATIVE
PROTEIN: NEGATIVE mg/dL
Urobilinogen, UA: 0.2 mg/dL (ref 0.0–1.0)
pH: 7 (ref 5.0–8.0)

## 2014-06-09 LAB — URINALYSIS, MICROSCOPIC ONLY
Bacteria, UA: NONE SEEN
CASTS: NONE SEEN
CRYSTALS: NONE SEEN
Squamous Epithelial / LPF: NONE SEEN

## 2014-06-09 LAB — BASIC METABOLIC PANEL WITH GFR
BUN: 10 mg/dL (ref 6–23)
CHLORIDE: 100 meq/L (ref 96–112)
CO2: 26 meq/L (ref 19–32)
Calcium: 9.7 mg/dL (ref 8.4–10.5)
Creat: 0.76 mg/dL (ref 0.50–1.10)
GFR, Est African American: >89 mL/min
Glucose, Bld: 84 mg/dL (ref 70–99)
POTASSIUM: 4.8 meq/L (ref 3.5–5.3)
Sodium: 139 mEq/L (ref 135–145)

## 2014-06-09 LAB — MICROALBUMIN / CREATININE URINE RATIO: Creatinine, Urine: 10.3 mg/dL

## 2014-06-09 LAB — VITAMIN D 25 HYDROXY (VIT D DEFICIENCY, FRACTURES): VIT D 25 HYDROXY: 86 ng/mL (ref 30–100)

## 2014-06-09 LAB — MAGNESIUM: MAGNESIUM: 2.1 mg/dL (ref 1.5–2.5)

## 2014-06-09 LAB — HEPATIC FUNCTION PANEL
ALBUMIN: 4.7 g/dL (ref 3.5–5.2)
ALT: 26 U/L (ref 0–35)
AST: 20 U/L (ref 0–37)
Alkaline Phosphatase: 73 U/L (ref 39–117)
BILIRUBIN DIRECT: 0.1 mg/dL (ref 0.0–0.3)
BILIRUBIN TOTAL: 0.4 mg/dL (ref 0.2–1.2)
Indirect Bilirubin: 0.3 mg/dL (ref 0.2–1.2)
Total Protein: 7.4 g/dL (ref 6.0–8.3)

## 2014-06-09 LAB — TSH: TSH: 2.516 u[IU]/mL (ref 0.350–4.500)

## 2014-06-09 LAB — INSULIN, FASTING: INSULIN FASTING, SERUM: 3.2 u[IU]/mL (ref 2.0–19.6)

## 2014-06-11 ENCOUNTER — Encounter: Payer: Self-pay | Admitting: Emergency Medicine

## 2014-06-11 DIAGNOSIS — J309 Allergic rhinitis, unspecified: Secondary | ICD-10-CM | POA: Insufficient documentation

## 2014-06-11 DIAGNOSIS — E559 Vitamin D deficiency, unspecified: Secondary | ICD-10-CM | POA: Insufficient documentation

## 2014-06-11 DIAGNOSIS — K824 Cholesterolosis of gallbladder: Secondary | ICD-10-CM | POA: Insufficient documentation

## 2014-06-11 DIAGNOSIS — M8580 Other specified disorders of bone density and structure, unspecified site: Secondary | ICD-10-CM | POA: Insufficient documentation

## 2014-06-11 NOTE — Progress Notes (Signed)
Subjective:    Patient ID: Yvonne Bell, female    DOB: 04/07/1966, 49 y.o.   MRN: [id number]  HPI Comments: 49 yo WF CPE and OVERDUE Cholesterol recheck. She is trying to eat better and exercises routinely. She prefer no cholesterol RX.   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 SHe is currently on Levaquin and has had positive improvement with wound. She has completed MRSA chlorine baths/ Mupirocin treatment in past. She notes her husband and daughter have also had MRSA in last year.   She is due for U/S recheck of GB polyp that was stable in 2014 originally diagnosed in 2012. She has had NEG GGT labs and denies abdominal symptoms.  She is due for BMD with osteopenia history and denies fractures. She is overdue for Mammo but will schedule exam.   She did not f/u at [facility] as advised for mole removal and full skin check AD in 2014. She has FHx of Melanoma. She has history of Atypical nevi. She denies skin changes.   LAST Abnormal LABS 01/2013 T 211 LDL 119 ALT 42 VIT D 120     Medication List       This list is accurate as of: 06/08/14 11:59 PM.  Always use your most recent med list.               levofloxacin 500 MG tablet  Commonly known as:  LEVAQUIN  Take 1 tablet 2 x day with food for 5 days, then 1 tablet daily with food for infection     mupirocin cream 2 %  Commonly known as:  BACTROBAN  Apply to affected area 3 times daily     VITAMIN D PO  Take 15,000 Int'l Units by mouth.       Allergies  Allergen Reactions  . Influenza Vaccines    Past Medical History  Diagnosis Date  . Hyperlipidemia   . Osteopenia   . Allergic rhinitis   . Vitamin D deficiency   . Gall bladder polyp 2012   Past Surgical History  Procedure Laterality Date  . Cesarean section    . Novasure ablation  2007  . Wisdom tooth extraction    . Skin surgery Left     Moderate atypia, abdomen   History  Substance Use Topics  .  Smoking status: Former Games developer  . Smokeless tobacco: Former Neurosurgeon    Quit date: 11/06/1992  . Alcohol Use: Yes   Family History  Problem Relation Age of Onset  . Hyperlipidemia Mother   . Hypertension Mother   . Osteopenia Mother   . Cancer Father 23    Melanoma with lung METS  . Colon polyps Sister   . Cancer Maternal Aunt     colon  . Cancer Paternal Uncle     lung  . Stroke Maternal Grandmother   . Aneurysm Maternal Grandmother   . Hyperlipidemia Maternal Grandfather   . Heart disease Maternal Grandfather      MAINTENANCE: Colonoscopy:n/a Mammo:09/09/2012 OVERDUE BMD:09/20/12 osteopenia AT GYN Pap/ Pelvic:09/20/12 WNL WUJ:[id number] 04/2014 WNL Dentist:Q 6 month CXR: OVER DUE with tobacco  IMMUNIZATIONS: Tdap:2010 Pneumovax:declines Zostavax:n/a Influenza:Allergic reaction 2 years in a row  Patient Care Team: Lucky Cowboy, MD as PCP - General (Internal Medicine) Lacretia Nicks Varney Baas, MD as Consulting Physician (Obstetrics and Gynecology)  Hyacinth Meeker, [facility]) Desmond Lope, (Dentist)  Review of Systems  Constitutional: Negative for fatigue.  Respiratory: Negative for cough and shortness of  breath.   Cardiovascular: Negative for chest pain.  Skin: Positive for wound.  All other systems reviewed and are negative.  BP 134/70 mmHg  Pulse 60  Temp(Src) 98.2 F (36.8 C) (Temporal)  Resp 18  Ht 5' 4.5" (1.638 m)  Wt 130 lb (58.968 kg)  BMI 21.98 kg/m2     Objective:   Physical Exam  Constitutional: She is oriented to person, place, and time. She appears well-developed and well-nourished. No distress.  HENT:  Head: Normocephalic.  Nose: Nose normal.  Mouth/Throat: Oropharynx is clear and moist.  Eyes: Conjunctivae and EOM are normal. Pupils are equal, round, and reactive to light. No scleral icterus.  Neck: Normal range of motion. Neck supple. No JVD present. No tracheal deviation present. No thyromegaly present.  Cardiovascular: Normal rate, regular rhythm, normal heart  sounds and intact distal pulses.   Pulmonary/Chest: Effort normal and breath sounds normal.  Abdominal: Soft. Bowel sounds are normal. She exhibits no distension and no mass. There is no tenderness.  Genitourinary:  Def GYN  Musculoskeletal: Normal range of motion. She exhibits no edema or tenderness.  Lymphadenopathy:    She has no cervical adenopathy.  Neurological: She is alert and oriented to person, place, and time. She has normal reflexes. No cranial nerve deficit. She exhibits normal muscle tone. Coordination normal.  Skin: Skin is warm and dry. No rash noted. No erythema.     Psychiatric: She has a normal mood and affect. Her behavior is normal. Judgment and thought content normal.  Nursing note and vitals reviewed.    EKG NSCSPT WNL      Assessment & Plan:  1. CPE- Update screening labs/ History/ Immunizations/ Testing as needed. Advised healthy diet, QD exercise, increase H20 and continue RX/ Vitamins AD. GET CXR updated with past tobacco HX  2. Cholesterol NONCOMPLIANT with follow up- recheck labs, Need to eat healthier and exercise AD.  3. Recurrent MRSA skin infections-repeat MRSA treatment with bleach baths/ mupirocin with entire family AD  4. Irreg ATYPICAL Nevi- needs removal ASAP, Patient did not f/u for DERM removal AD in 2013/2014. Advised of concerns for Melanoma with increased pigment and FHX Melanoma in Father  5. GB Polyp- Recheck labs and Ultrasound, 2 years last scan stable

## 2014-06-11 NOTE — Progress Notes (Deleted)
Subjective:    Patient ID: Yvonne Bell, female    DOB: 1965/12/24, 49 y.o.   MRN: [id number]  HPI Comments: Right proximal tib/fib MRSA healing. \  She has had stable GB polyp and GGT labs but is due for recheck     Medication List       This list is accurate as of: 06/08/14 11:59 PM.  Always use your most recent med list.               levofloxacin 500 MG tablet  Commonly known as:  LEVAQUIN  Take 1 tablet 2 x day with food for 5 days, then 1 tablet daily with food for infection     mupirocin cream 2 %  Commonly known as:  BACTROBAN  Apply to affected area 3 times daily     VITAMIN D PO  Take 15,000 Int'l Units by mouth.       Allergies  Allergen Reactions  . Influenza Vaccines    Past Medical History  Diagnosis Date  . Hyperlipidemia   . Osteopenia   . Allergic rhinitis   . Vitamin D deficiency   . Gall bladder polyp 2012    Past Surgical History  Procedure Laterality Date  . Cesarean section    . Novasure ablation  2007  . Wisdom tooth extraction    . Skin surgery Left     Moderate atypia, abdomen     History  Substance Use Topics  . Smoking status: Former Games developer  . Smokeless tobacco: Former Neurosurgeon    Quit date: 11/06/1992  . Alcohol Use: Yes   Family History  Problem Relation Age of Onset  . Hyperlipidemia Mother   . Hypertension Mother   . Osteopenia Mother   . Cancer Father 67    Melanoma with lung METS  . Colon polyps Sister   . Cancer Maternal Aunt     colon  . Cancer Paternal Uncle     lung  . Stroke Maternal Grandmother   . Aneurysm Maternal Grandmother   . Hyperlipidemia Maternal Grandfather   . Heart disease Maternal Grandfather      MAINTENANCE: Colonoscopy: Mammo:09/09/2012 OVERDUE BMD:09/20/12 osteopenia AT GYN Pap/ Pelvic:09/20/12 WNL [id number]:[id number] 04/2014 WNL Dentist:Q 6 month CXR: OVER DUE with tobacco  IMMUNIZATIONS: Tdap:2010 Pneumovax:declines Zostavax:n/a Influenza:Allergic reaction 2 years in a  row  Patient Care Team: Lucky Cowboy, MD as PCP - General (Internal Medicine)   Review of Systems BP 134/70 mmHg  Pulse 60  Temp(Src) 98.2 F (36.8 C) (Temporal)  Resp 18  Ht 5' 4.5" (1.638 m)  Wt 130 lb (58.968 kg)  BMI 21.98 kg/m2     Objective:   Physical Exam        Assessment & Plan:  1. CPE- Update screening labs/ History/ Immunizations/ Testing as needed. Advised healthy diet, QD exercise, increase H20 and continue RX/ Vitamins AD.  2. Cholesterol NONCOMPLIANT with follow up- recheck labs, Need to eat healthier and exercise AD.  3. Recurrent MRSA skin infections-repeat MRSA treatment with bleach baths/ mupirocin with entire family AD  4. Irreg ATYPICAL Nevi- needs removal ASAP, Patient did not f/u for DERM removal AD in 2013/2014. Advised of concerns for Melanoma with increased pigment and FHX Melanoma in Father  5. GB Polyp-

## 2014-06-14 ENCOUNTER — Other Ambulatory Visit: Payer: Self-pay

## 2014-06-14 DIAGNOSIS — Z1231 Encounter for screening mammogram for malignant neoplasm of breast: Secondary | ICD-10-CM

## 2014-06-15 ENCOUNTER — Ambulatory Visit
Admission: RE | Admit: 2014-06-15 | Discharge: 2014-06-15 | Disposition: A | Discharge status: Home / Self Care | Payer: BLUE CROSS/BLUE SHIELD | Source: Ambulatory Visit | Attending: Emergency Medicine | Admitting: Emergency Medicine

## 2014-06-15 DIAGNOSIS — R932 Abnormal findings on diagnostic imaging of liver and biliary tract: Secondary | ICD-10-CM

## 2014-06-15 DIAGNOSIS — R059 Cough, unspecified: Secondary | ICD-10-CM

## 2014-06-15 DIAGNOSIS — Z0000 Encounter for general adult medical examination without abnormal findings: Secondary | ICD-10-CM

## 2014-06-15 DIAGNOSIS — R05 Cough: Secondary | ICD-10-CM

## 2014-06-19 ENCOUNTER — Encounter: Payer: Self-pay | Admitting: Emergency Medicine

## 2014-06-19 ENCOUNTER — Ambulatory Visit (INDEPENDENT_AMBULATORY_CARE_PROVIDER_SITE_OTHER): Payer: BLUE CROSS/BLUE SHIELD | Admitting: Emergency Medicine

## 2014-06-19 VITALS — Temp 98.2°F

## 2014-06-19 DIAGNOSIS — K824 Cholesterolosis of gallbladder: Secondary | ICD-10-CM

## 2014-06-19 DIAGNOSIS — R239 Unspecified skin changes: Secondary | ICD-10-CM

## 2014-06-19 DIAGNOSIS — Z800 Family history of malignant neoplasm of digestive organs: Secondary | ICD-10-CM

## 2014-06-19 DIAGNOSIS — E782 Mixed hyperlipidemia: Secondary | ICD-10-CM

## 2014-06-19 NOTE — Progress Notes (Signed)
Subjective:     Patient ID: Yvonne Bell, female   DOB: January 04, 1966, 49 y.o.   MRN: [id number]  HPI Comments: 49 yo WF for f/u results for U/S Gall Bladder (GB) polyp, CXR, LIPID labs and mole removal. She notes mole has increased in pigment over the last year and father had history of Melanoma.  She decided to try to improve diet and try FLAX  For her cholesterol and refuses cholesterol RX currently. Her CXR was WNL. Her GB polyp has increased in size but she is asymptomatic. She is concerned with sister with colon polyp history, MA with colon cancer and mother whom refuses colonoscopy.   FINDINGS Abdominal U/S: Gallbladder: No shadowing gallstones are noted within gallbladder. Again noted gallbladder wall polyp measures 7 x 7.5 mm. On the prior exam measures 6 x 6 mm. No sonographic Murphy's sign  Common bile duct: Diameter: 3 mm in diameter within normal limits.  Liver: No focal lesion identified. Within normal limits in parenchymal echogenicity.  IVC: No abnormality visualized.  Pancreas: Visualized portion unremarkable. Pancreatic tail is not visualized due to abundant bowel gas.  Spleen: Size and appearance within normal limits. Measures up to 5.7 cm in diameter  Right Kidney: Length: 10.2 cm. Echogenicity within normal limits. No mass or hydronephrosis visualized.  Left Kidney: Length: 10.9 cm. Echogenicity within normal limits. No mass or hydronephrosis visualized.  Abdominal aorta: No aneurysm visualized. Abdominal aorta measures up to 1.5 cm in diameter.  Other findings: None.  IMPRESSION: 1. No shadowing gallstones are noted within gallbladder. Again noted gallbladder wall polyp measures 7.5 x 7 mm. On the prior exam measures 6 mm. 2. No thickening of gallbladder wall. No sonographic Murphy's sign. 3. No hydronephrosis. 4. No aortic aneurysm.    Medication List       This list is accurate as of: 06/19/14  1:15 PM.  Always use your most recent med list.                levofloxacin 500 MG tablet  Commonly known as:  LEVAQUIN  Take 1 tablet 2 x day with food for 5 days, then 1 tablet daily with food for infection     mupirocin cream 2 %  Commonly known as:  BACTROBAN  Apply to affected area 3 times daily     VITAMIN D PO  Take 15,000 Int'l Units by mouth.       Allergies  Allergen Reactions  . Influenza Vaccines    Past Medical History  Diagnosis Date  . Hyperlipidemia   . Osteopenia   . Allergic rhinitis   . Vitamin D deficiency   . Gall bladder polyp 2012     Review of Systems  Constitutional: Negative for fatigue and unexpected weight change.  Gastrointestinal: Negative for nausea, abdominal pain, diarrhea and blood in stool.  Skin: Positive for color change.  All other systems reviewed and are negative.  Temp(Src) 98.2 F (36.8 C) (Temporal)     Objective:   Physical Exam  Constitutional: She appears well-developed.  Abdominal: Soft. There is no tenderness.  Musculoskeletal: Normal range of motion.  Neurological: She is alert.  Skin: No rash noted.     Psychiatric: Judgment normal.  Nursing note and vitals reviewed.      Assessment:     1. Lab and Imaging discussion visit   2. Skin change/ Atypical mole with family history of Melanoma    Plan:     1. Advised of results of labs/ Imaging.  Advised with GB polyp increase in size needs referral to General surgeon for further discussion as to continued monitoring vs GB removal. Advised with FHX colon cancer/ Polyps needs to get colonoscopy screening performed.   2.  Irreg/ atypical mole- Verbal permission obtained. Area prepped with alcohol. 1% lidocaine used at each area approx 1 cc ea. Shave excision performed with clear borders obtained and sent for pathology. Area bandaged with Neosporin/ guaze/ paper tape. Wound hygiene given. SX of infection explained pt w/c with any concerns.  Advised monitoring closely of mole on suprapubic area and call for  removal with any changes. Advised monthly picture to compare changes. Advised will need removal if pathology   Long discussion with patient about risk

## 2014-06-22 ENCOUNTER — Ambulatory Visit
Admission: RE | Admit: 2014-06-22 | Discharge: 2014-06-22 | Disposition: A | Discharge status: Home / Self Care | Payer: BLUE CROSS/BLUE SHIELD | Source: Ambulatory Visit

## 2014-06-22 ENCOUNTER — Encounter: Payer: Self-pay | Admitting: Gastroenterology

## 2014-06-22 DIAGNOSIS — Z1231 Encounter for screening mammogram for malignant neoplasm of breast: Secondary | ICD-10-CM

## 2014-06-26 ENCOUNTER — Other Ambulatory Visit: Payer: Self-pay | Admitting: Emergency Medicine

## 2014-06-26 ENCOUNTER — Other Ambulatory Visit: Payer: Self-pay

## 2014-06-26 DIAGNOSIS — R319 Hematuria, unspecified: Secondary | ICD-10-CM

## 2014-06-27 LAB — URINALYSIS, ROUTINE W REFLEX MICROSCOPIC
Bilirubin Urine: NEGATIVE
Glucose, UA: NEGATIVE mg/dL
Hgb urine dipstick: NEGATIVE
KETONES UR: NEGATIVE mg/dL
Leukocytes, UA: NEGATIVE
NITRITE: NEGATIVE
PH: 7 (ref 5.0–8.0)
Protein, ur: NEGATIVE mg/dL
Specific Gravity, Urine: 1.007 (ref 1.005–1.030)
UROBILINOGEN UA: 0.2 mg/dL (ref 0.0–1.0)

## 2014-07-18 ENCOUNTER — Ambulatory Visit (INDEPENDENT_AMBULATORY_CARE_PROVIDER_SITE_OTHER): Payer: BLUE CROSS/BLUE SHIELD | Admitting: Internal Medicine

## 2014-07-18 DIAGNOSIS — J069 Acute upper respiratory infection, unspecified: Secondary | ICD-10-CM

## 2014-07-18 DIAGNOSIS — Z8614 Personal history of Methicillin resistant Staphylococcus aureus infection: Secondary | ICD-10-CM

## 2014-07-18 MED ORDER — PREDNISONE 20 MG PO TABS: ORAL_TABLET | ORAL | Status: DC

## 2014-07-18 MED ORDER — AZITHROMYCIN 250 MG PO TABS: ORAL_TABLET | ORAL | Status: DC

## 2014-07-18 NOTE — Patient Instructions (Signed)
Upper Respiratory Infection, Adult An upper respiratory infection (URI) is also sometimes known as the common cold. The upper respiratory tract includes the nose, sinuses, throat, trachea, and bronchi. Bronchi are the airways leading to the lungs. Most people improve within 1 week, but symptoms can last up to 2 weeks. A residual cough may last even longer.  CAUSES Many different viruses can infect the tissues lining the upper respiratory tract. The tissues become irritated and inflamed and often become very moist. Mucus production is also common. A cold is contagious. You can easily spread the virus to others by oral contact. This includes kissing, sharing a glass, coughing, or sneezing. Touching your mouth or nose and then touching a surface, which is then touched by another person, can also spread the virus. SYMPTOMS  Symptoms typically develop 1 to 3 days after you come in contact with a cold virus. Symptoms vary from person to person. They may include:  Runny nose.  Sneezing.  Nasal congestion.  Sinus irritation.  Sore throat.  Loss of voice (laryngitis).  Cough.  Fatigue.  Muscle aches.  Loss of appetite.  Headache.  Low-grade fever. DIAGNOSIS  You might diagnose your own cold based on familiar symptoms, since most people get a cold 2 to 3 times a year. Your caregiver can confirm this based on your exam. Most importantly, your caregiver can check that your symptoms are not due to another disease such as strep throat, sinusitis, pneumonia, asthma, or epiglottitis. Blood tests, throat tests, and X-rays are not necessary to diagnose a common cold, but they may sometimes be helpful in excluding other more serious diseases. Your caregiver will decide if any further tests are required. RISKS AND COMPLICATIONS  You may be at risk for a more severe case of the common cold if you smoke cigarettes, have chronic heart disease (such as heart failure) or lung disease (such as asthma), or if  you have a weakened immune system. The very young and very old are also at risk for more serious infections. Bacterial sinusitis, middle ear infections, and bacterial pneumonia can complicate the common cold. The common cold can worsen asthma and chronic obstructive pulmonary disease (COPD). Sometimes, these complications can require emergency medical care and may be life-threatening. PREVENTION  The best way to protect against getting a cold is to practice good hygiene. Avoid oral or hand contact with people with cold symptoms. Wash your hands often if contact occurs. There is no clear evidence that vitamin C, vitamin E, echinacea, or exercise reduces the chance of developing a cold. However, it is always recommended to get plenty of rest and practice good nutrition. TREATMENT  Treatment is directed at relieving symptoms. There is no cure. Antibiotics are not effective, because the infection is caused by a virus, not by bacteria. Treatment may include:  Increased fluid intake. Sports drinks offer valuable electrolytes, sugars, and fluids.  Breathing heated mist or steam (vaporizer or shower).  Eating chicken soup or other clear broths, and maintaining good nutrition.  Getting plenty of rest.  Using gargles or lozenges for comfort.  Controlling fevers with ibuprofen or acetaminophen as directed by your caregiver.  Increasing usage of your inhaler if you have asthma. Zinc gel and zinc lozenges, taken in the first 24 hours of the common cold, can shorten the duration and lessen the severity of symptoms. Pain medicines may help with fever, muscle aches, and throat pain. A variety of non-prescription medicines are available to treat congestion and runny nose. Your caregiver   can make recommendations and may suggest nasal or lung inhalers for other symptoms.  HOME CARE INSTRUCTIONS   Only take over-the-counter or prescription medicines for pain, discomfort, or fever as directed by your  caregiver.  Use a warm mist humidifier or inhale steam from a shower to increase air moisture. This may keep secretions moist and make it easier to breathe.  Drink enough water and fluids to keep your urine clear or pale yellow.  Rest as needed.  Return to work when your temperature has returned to normal or as your caregiver advises. You may need to stay home longer to avoid infecting others. You can also use a face mask and careful hand washing to prevent spread of the virus. SEEK MEDICAL CARE IF:   After the first few days, you feel you are getting worse rather than better.  You need your caregiver's advice about medicines to control symptoms.  You develop chills, worsening shortness of breath, or brown or red sputum. These may be signs of pneumonia.  You develop yellow or brown nasal discharge or pain in the face, especially when you bend forward. These may be signs of sinusitis.  You develop a fever, swollen neck glands, pain with swallowing, or white areas in the back of your throat. These may be signs of strep throat. SEEK IMMEDIATE MEDICAL CARE IF:   You have a fever.  You develop severe or persistent headache, ear pain, sinus pain, or chest pain.  You develop wheezing, a prolonged cough, cough up blood, or have a change in your usual mucus (if you have chronic lung disease).  You develop sore muscles or a stiff neck. Document Released: 10/07/2000 Document Revised: 07/06/2011 Document Reviewed: 07/19/2013 [employer] Patient Information 2015 [employer]. This information is not intended to replace advice given to you by your health care provider. Make sure you discuss any questions you have with your health care provider.  

## 2014-07-18 NOTE — Progress Notes (Signed)
Subjective:    Patient ID: Yvonne Bell, female    DOB: October 02, 1965, 49 y.o.   MRN: [id number]  HPI  Patient presents to the office for 5 days of worsening nasal congestion, runny nose, mild dry cough, and fatigue.  She reports that she has been taking allergra and also using some nasacort at home with no relief.  She reports that her coworker has similar symptoms.  She cannot sleep at night due to congestion.  She would also like to have her MRSA nasal swab done today as there is a history of MRSA in the house and she has completed her course of antibiotics.     Review of Systems  Constitutional: Negative for fever, chills and fatigue.  HENT: Positive for congestion, ear pain, postnasal drip, rhinorrhea and sinus pressure. Negative for sore throat, tinnitus, trouble swallowing and voice change.   Respiratory: Positive for cough. Negative for chest tightness, shortness of breath and wheezing.   Cardiovascular: Negative for chest pain and palpitations.  All other systems reviewed and are negative.      Objective:   Physical Exam  Constitutional: She is oriented to person, place, and time. She appears well-developed and well-nourished. No distress.  HENT:  Head: Normocephalic and atraumatic.  Right Ear: A middle ear effusion is present.  Left Ear: A middle ear effusion is present.  Nose: Mucosal edema present. Right sinus exhibits no maxillary sinus tenderness and no frontal sinus tenderness. Left sinus exhibits no maxillary sinus tenderness and no frontal sinus tenderness.  Mouth/Throat: Uvula is midline and mucous membranes are normal. No trismus in the jaw. Posterior oropharyngeal edema (minimal) present. No oropharyngeal exudate or posterior oropharyngeal erythema.  Eyes: Conjunctivae and EOM are normal. Pupils are equal, round, and reactive to light. No scleral icterus.  Neck: Normal range of motion. Neck supple. No JVD present. No thyromegaly present.  Cardiovascular: Normal rate,  regular rhythm, normal heart sounds and intact distal pulses.  Exam reveals no gallop and no friction rub.   No murmur heard. Pulmonary/Chest: Effort normal and breath sounds normal. No respiratory distress. She has no wheezes. She has no rales. She exhibits no tenderness.  Abdominal: Soft.  Musculoskeletal: Normal range of motion.  Lymphadenopathy:    She has no cervical adenopathy.  Neurological: She is alert and oriented to person, place, and time.  Skin: Skin is warm and dry. She is not diaphoretic.  Psychiatric: She has a normal mood and affect. Her behavior is normal. Judgment and thought content normal.  Nursing note and vitals reviewed.         Assessment & Plan:    1. History of MRSA infection  - MRSA  Screen  2. Acute URI -zpak -prednisone -cont antihistamine -cont nasacort -benadryl and pepcid at night as needed  F/u PRN

## 2014-07-21 LAB — MRSA CULTURE: Organism ID, Bacteria: NO GROWTH

## 2014-07-27 ENCOUNTER — Other Ambulatory Visit: Payer: Self-pay | Admitting: Internal Medicine

## 2014-08-20 ENCOUNTER — Ambulatory Visit: Payer: Self-pay | Admitting: Emergency Medicine

## 2014-09-05 ENCOUNTER — Encounter: Payer: Self-pay | Admitting: Emergency Medicine

## 2014-09-05 ENCOUNTER — Ambulatory Visit (INDEPENDENT_AMBULATORY_CARE_PROVIDER_SITE_OTHER): Payer: 59 | Admitting: Emergency Medicine

## 2014-09-05 ENCOUNTER — Other Ambulatory Visit: Payer: Self-pay | Admitting: Emergency Medicine

## 2014-09-05 VITALS — BP 122/70 | HR 68 | Temp 98.2°F | Resp 18 | Ht 64.5 in | Wt 125.0 lb

## 2014-09-05 DIAGNOSIS — L819 Disorder of pigmentation, unspecified: Secondary | ICD-10-CM

## 2014-09-05 DIAGNOSIS — E782 Mixed hyperlipidemia: Secondary | ICD-10-CM

## 2014-09-05 DIAGNOSIS — D229 Melanocytic nevi, unspecified: Secondary | ICD-10-CM

## 2014-09-05 LAB — LIPID PANEL
CHOLESTEROL: 186 mg/dL (ref 0–200)
HDL: 87 mg/dL (ref >=46)
LDL CALC: 84 mg/dL (ref 0–99)
TRIGLYCERIDES: 77 mg/dL (ref <150)
Total CHOL/HDL Ratio: 2.1 Ratio
VLDL: 15 mg/dL (ref 0–40)

## 2014-09-05 LAB — HEPATIC FUNCTION PANEL
ALBUMIN: 4.4 g/dL (ref 3.5–5.2)
ALT: 19 U/L (ref 0–35)
AST: 15 U/L (ref 0–37)
Alkaline Phosphatase: 59 U/L (ref 39–117)
Bilirubin, Direct: 0.1 mg/dL (ref 0.0–0.3)
Indirect Bilirubin: 0.5 mg/dL (ref 0.2–1.2)
Total Bilirubin: 0.6 mg/dL (ref 0.2–1.2)
Total Protein: 6.8 g/dL (ref 6.0–8.3)

## 2014-09-05 NOTE — Progress Notes (Signed)
Subjective:    Patient ID: Yvonne Bell, female    DOB: 03/20/1966, 49 y.o.   MRN: [id number]  HPI Comments: 49 yo WF 3 month cholesterol f/u. She is eating a little better. She has been drinking a little on the weekends only. She is exercising routinely. She prefers to stay off cholesterol RX.    Lab Results      Component                Value               Date                      WBC                      7.3                 06/08/2014                HGB                      15.2*               06/08/2014                HCT                      45.2                06/08/2014                PLT                      311                 06/08/2014                GLUCOSE                  84                  06/08/2014                CHOL                     226*                06/08/2014                TRIG                     61                  06/08/2014                HDL                      95                  06/08/2014                LDLCALC                  119*                06/08/2014  ALT                      26                  06/08/2014                AST                      20                  06/08/2014                NA                       139                 06/08/2014                K                        4.8                 06/08/2014                CL                       100                 06/08/2014                CREATININE               0.76                06/08/2014                BUN                      10                  06/08/2014                CO2                      26                  06/08/2014                TSH                      2.516               06/08/2014                HGBA1C                   5.2                 06/08/2014                MICROALBUR               <0.2                06/08/2014            She is concerned because mole removed has returned on upper abdomen. She  does not want to go to derm for  re-excision.      Medication List       This list is accurate as of: 09/05/14  9:36 AM.  Always use your most recent med list.               VITAMIN D PO  Take 15,000 Int'l Units by mouth.       Allergies  Allergen Reactions  . Influenza Vaccines    Past Medical History  Diagnosis Date  . Hyperlipidemia   . Osteopenia   . Allergic rhinitis   . Vitamin D deficiency   . Gall bladder polyp 2012     Review of Systems  Respiratory: Negative for cough and shortness of breath.   Cardiovascular: Negative for chest pain.  Skin: Positive for color change.  All other systems reviewed and are negative.  BP 122/70 mmHg  Pulse 68  Temp(Src) 98.2 F (36.8 C) (Temporal)  Resp 18  Ht 5' 4.5" (1.638 m)  Wt 125 lb (56.7 kg)  BMI 21.13 kg/m2     Objective:   Physical Exam  Constitutional: She is oriented to person, place, and time. She appears well-developed and well-nourished. No distress.  HENT:  Head: Normocephalic and atraumatic.  Eyes: Conjunctivae and EOM are normal.  Neck: Normal range of motion. Neck supple. No JVD present. No thyromegaly present.  Cardiovascular: Normal rate, regular rhythm, normal heart sounds and intact distal pulses.   Pulmonary/Chest: Effort normal and breath sounds normal.  Abdominal: Soft. Bowel sounds are normal. There is no tenderness.  Musculoskeletal: Normal range of motion. She exhibits no edema or tenderness.  Lymphadenopathy:    She has no cervical adenopathy.  Neurological: She is alert and oriented to person, place, and time. No cranial nerve deficit.  Skin: Skin is warm and dry.     Psychiatric: She has a normal mood and affect. Judgment normal.  Nursing note and vitals reviewed.  Verbal permission obtained. Area prepped with alcohol. Lidocaine 1% 2 cc given, deeper shave excision performed with clear margins obtained. Electrocautery used to minimize bleeding. Guaze/ neosporin/ bandage applied. Specimen sent for pathology.      Assessment & Plan:  1. Cholesterol- recheck labs, Need to eat healthier and exercise AD.   2. Atypical mole recurrence- Re-excise if reoccurs will need derm evaluation. Advised if reoccurs again need DERM removal with sutures.

## 2014-09-07 ENCOUNTER — Encounter: Payer: BLUE CROSS/BLUE SHIELD | Admitting: Gastroenterology

## 2014-10-02 ENCOUNTER — Ambulatory Visit (INDEPENDENT_AMBULATORY_CARE_PROVIDER_SITE_OTHER): Payer: 59 | Admitting: Internal Medicine

## 2014-10-02 ENCOUNTER — Encounter: Payer: Self-pay | Admitting: Internal Medicine

## 2014-10-02 VITALS — BP 132/80 | HR 72 | Temp 98.0°F | Resp 18 | Ht 64.5 in

## 2014-10-02 DIAGNOSIS — J069 Acute upper respiratory infection, unspecified: Secondary | ICD-10-CM

## 2014-10-02 MED ORDER — MOMETASONE FUROATE 50 MCG/ACT NA SUSP: 2.0000 | Freq: Every day | NASAL | Status: DC

## 2014-10-02 MED ORDER — PREDNISONE 20 MG PO TABS: ORAL_TABLET | ORAL | Status: DC

## 2014-10-02 MED ORDER — AZITHROMYCIN 250 MG PO TABS: ORAL_TABLET | ORAL | Status: DC

## 2014-10-02 NOTE — Progress Notes (Signed)
Patient ID: Yvonne Bell, female   DOB: April 07, 1966, 49 y.o.   MRN: [id number]  HPI  Patient presents to the office for evaluation of cough.  It has been going on for 3 days.  Patient reports dry, minimal cough.  They also endorse change in voice, postnasal drip and sore throat, nasal congestion, and fluid in ears..  They have tried antihistamines.  They report that nothing has worked.  They admits to other sick contacts.  Her husband has a similar type of symptoms.    Review of Systems  Constitutional: Negative for fever, chills and malaise/fatigue.  HENT: Positive for congestion, ear pain, hearing loss and sore throat.   Respiratory: Negative for cough, shortness of breath and wheezing.   Cardiovascular: Negative for chest pain, palpitations and leg swelling.  Neurological: Positive for headaches.    PE:  General:  Alert and non-toxic, WDWN, NAD HEENT: NCAT, PERLA, EOM normal, no occular discharge or erythema.  Nasal mucosal edema with sinus tenderness to palpation.  Oropharynx clear with minimal oropharyngeal edema and erythema.  Mucous membranes moist and pink. Neck:  Cervical adenopathy Chest:  RRR no MRGs.  Lungs clear to auscultation A&P with no wheezes rhonchi or rales.   Abdomen: +BS x 4 quadrants, soft, non-tender, no guarding, rigidity, or rebound. Skin: warm and dry no rash Neuro: A&Ox4, CN II-XII grossly intact  Assessment and Plan:   1. Acute upper respiratory infection -cont allegra -nasal saline -mucinex - mometasone (NASONEX) 50 MCG/ACT nasal spray; Place 2 sprays into the nose daily.  Dispense: 17 g; Refill: 2 - predniSONE (DELTASONE) 20 MG tablet; 3 tabs po day one, then 2 tabs daily x 4 days  Dispense: 11 tablet; Refill: 0 - azithromycin (ZITHROMAX Z-PAK) 250 MG tablet; 2 po day one, then 1 daily x 4 days  Dispense: 5 tablet; Refill: 0

## 2014-10-03 ENCOUNTER — Other Ambulatory Visit: Payer: Self-pay | Admitting: *Deleted

## 2014-10-03 MED ORDER — FLUTICASONE PROPIONATE 50 MCG/ACT NA SUSP: 2.0000 | Freq: Every day | NASAL | Status: DC

## 2015-01-17 ENCOUNTER — Ambulatory Visit (INDEPENDENT_AMBULATORY_CARE_PROVIDER_SITE_OTHER): Payer: 59 | Admitting: Internal Medicine

## 2015-01-17 ENCOUNTER — Encounter: Payer: Self-pay | Admitting: Internal Medicine

## 2015-01-17 VITALS — BP 126/74 | HR 72 | Temp 97.8°F | Resp 18

## 2015-01-17 DIAGNOSIS — J309 Allergic rhinitis, unspecified: Secondary | ICD-10-CM | POA: Diagnosis not present

## 2015-01-17 MED ORDER — AZITHROMYCIN 250 MG PO TABS: ORAL_TABLET | ORAL | Status: DC

## 2015-01-17 MED ORDER — FLUTICASONE PROPIONATE 50 MCG/ACT NA SUSP: 2.0000 | Freq: Every day | NASAL | Status: DC

## 2015-01-17 MED ORDER — PREDNISONE 20 MG PO TABS: ORAL_TABLET | ORAL | Status: DC

## 2015-01-17 NOTE — Progress Notes (Signed)
Subjective:    Patient ID: Yvonne Bell, female    DOB: 1965/06/10, 49 y.o.   MRN: [id number]  Sore Throat  Associated symptoms include congestion, coughing, ear pain and headaches. Pertinent negatives include no shortness of breath, trouble swallowing or vomiting.  Patient presents to the office for evaluation of sore throat x days.  Patient reports that the sore throat is accompanied by chest congestion, minor dry cough.  She has been taking mucinex at home.  She reports that she does feel like she has been having some drainage. She has been taking allegra daily.  Around sick patients.  No sick contacts at home     Review of Systems  Constitutional: Positive for fatigue. Negative for fever and chills.  HENT: Positive for congestion, ear pain, postnasal drip and sore throat. Negative for nosebleeds, rhinorrhea, sinus pressure and trouble swallowing.   Respiratory: Positive for cough. Negative for chest tightness, shortness of breath and wheezing.   Cardiovascular: Negative for chest pain and palpitations.  Gastrointestinal: Negative for nausea and vomiting.  Neurological: Positive for headaches.       Objective:   Physical Exam  Constitutional: She appears well-developed and well-nourished. No distress.  HENT:  Head: Normocephalic.  Nose: Mucosal edema present. Right sinus exhibits no maxillary sinus tenderness and no frontal sinus tenderness. Left sinus exhibits no maxillary sinus tenderness and no frontal sinus tenderness.  Mouth/Throat: Uvula is midline, oropharynx is clear and moist and mucous membranes are normal. No trismus in the jaw. No oropharyngeal exudate, posterior oropharyngeal edema or posterior oropharyngeal erythema.  Eyes: Conjunctivae are normal. No scleral icterus.  Neck: Normal range of motion. Neck supple. No JVD present. No thyromegaly present.  Cardiovascular: Normal rate, regular rhythm, normal heart sounds and intact distal pulses.  Exam reveals no gallop and  no friction rub.   No murmur heard. Pulmonary/Chest: Effort normal and breath sounds normal. No respiratory distress. She has no wheezes. She has no rales. She exhibits no tenderness.  Lymphadenopathy:    She has no cervical adenopathy.  Skin: She is not diaphoretic.  Nursing note and vitals reviewed.   Filed Vitals:   01/17/15 1424  BP: 126/74  Pulse: 72  Temp: 97.8 F (36.6 C)  Resp: 18          Assessment & Plan:    1. Allergic rhinitis, unspecified allergic rhinitis type -prednisone -flonase -if no improvement start zpak

## 2015-03-05 ENCOUNTER — Other Ambulatory Visit: Payer: Self-pay | Admitting: Internal Medicine

## 2015-03-12 ENCOUNTER — Other Ambulatory Visit: Payer: Self-pay

## 2015-03-12 DIAGNOSIS — Z1231 Encounter for screening mammogram for malignant neoplasm of breast: Secondary | ICD-10-CM

## 2015-06-11 ENCOUNTER — Encounter: Payer: Self-pay | Admitting: Internal Medicine

## 2015-06-24 ENCOUNTER — Ambulatory Visit
Admission: RE | Admit: 2015-06-24 | Discharge: 2015-06-24 | Disposition: A | Discharge status: Home / Self Care | Payer: 59 | Source: Ambulatory Visit

## 2015-06-24 DIAGNOSIS — Z1231 Encounter for screening mammogram for malignant neoplasm of breast: Secondary | ICD-10-CM

## 2016-06-10 ENCOUNTER — Encounter: Payer: Self-pay | Admitting: Internal Medicine

## 2016-06-15 ENCOUNTER — Other Ambulatory Visit: Payer: Self-pay | Admitting: Internal Medicine

## 2016-06-15 DIAGNOSIS — Z1231 Encounter for screening mammogram for malignant neoplasm of breast: Secondary | ICD-10-CM

## 2016-06-30 ENCOUNTER — Ambulatory Visit
Admission: RE | Admit: 2016-06-30 | Discharge: 2016-06-30 | Disposition: A | Discharge status: Home / Self Care | Payer: 59 | Source: Ambulatory Visit | Attending: Internal Medicine | Admitting: Internal Medicine

## 2016-06-30 DIAGNOSIS — Z1231 Encounter for screening mammogram for malignant neoplasm of breast: Secondary | ICD-10-CM

## 2017-03-01 ENCOUNTER — Ambulatory Visit (INDEPENDENT_AMBULATORY_CARE_PROVIDER_SITE_OTHER): Payer: 59

## 2017-03-01 DIAGNOSIS — Z23 Encounter for immunization: Secondary | ICD-10-CM | POA: Diagnosis not present

## 2017-03-01 NOTE — Progress Notes (Signed)
Pt presents for FLU vaccine.

## 2017-03-18 ENCOUNTER — Other Ambulatory Visit: Payer: Self-pay | Admitting: Internal Medicine

## 2017-03-18 MED ORDER — PREDNISONE 20 MG PO TABS: ORAL_TABLET | ORAL | 0 refills | Status: DC

## 2017-03-18 MED ORDER — IPRATROPIUM BROMIDE 0.06 % NA SOLN: 2.0000 | Freq: Three times a day (TID) | NASAL | 2 refills | Status: DC

## 2017-03-18 MED ORDER — PROMETHAZINE-DM 6.25-15 MG/5ML PO SYRP: ORAL_SOLUTION | ORAL | 1 refills | Status: DC

## 2017-03-18 MED ORDER — AZITHROMYCIN 250 MG PO TABS: ORAL_TABLET | ORAL | 1 refills | Status: DC

## 2017-04-05 ENCOUNTER — Other Ambulatory Visit: Payer: Self-pay | Admitting: Internal Medicine

## 2017-04-05 DIAGNOSIS — M25571 Pain in right ankle and joints of right foot: Secondary | ICD-10-CM

## 2017-04-06 ENCOUNTER — Encounter (HOSPITAL_COMMUNITY): Payer: Self-pay | Admitting: Radiology

## 2017-04-06 ENCOUNTER — Ambulatory Visit (HOSPITAL_COMMUNITY)
Admission: RE | Admit: 2017-04-06 | Discharge: 2017-04-06 | Disposition: A | Discharge status: Home / Self Care | Payer: 59 | Source: Ambulatory Visit | Attending: Internal Medicine | Admitting: Internal Medicine

## 2017-04-06 DIAGNOSIS — S89391A Other physeal fracture of lower end of right fibula, initial encounter for closed fracture: Secondary | ICD-10-CM | POA: Diagnosis not present

## 2017-04-06 DIAGNOSIS — W19XXXA Unspecified fall, initial encounter: Secondary | ICD-10-CM | POA: Diagnosis not present

## 2017-04-06 DIAGNOSIS — M25571 Pain in right ankle and joints of right foot: Secondary | ICD-10-CM | POA: Diagnosis present

## 2017-06-25 ENCOUNTER — Other Ambulatory Visit: Payer: Self-pay | Admitting: Internal Medicine

## 2017-06-25 MED ORDER — PREDNISONE 20 MG PO TABS: ORAL_TABLET | ORAL | 0 refills | Status: DC

## 2017-06-25 MED ORDER — PROMETHAZINE-DM 6.25-15 MG/5ML PO SYRP: ORAL_SOLUTION | ORAL | 1 refills | Status: DC

## 2017-06-25 MED ORDER — AZITHROMYCIN 250 MG PO TABS: ORAL_TABLET | ORAL | 1 refills | Status: DC

## 2017-06-26 ENCOUNTER — Other Ambulatory Visit: Payer: Self-pay | Admitting: Internal Medicine

## 2017-06-26 MED ORDER — ALPRAZOLAM 0.5 MG PO TABS
ORAL_TABLET | ORAL | 0 refills | Status: DC
Start: 2017-06-26 — End: 2020-09-27

## 2018-02-25 ENCOUNTER — Ambulatory Visit (INDEPENDENT_AMBULATORY_CARE_PROVIDER_SITE_OTHER): Payer: 59

## 2018-02-25 VITALS — Temp 97.9°F

## 2018-02-25 DIAGNOSIS — Z23 Encounter for immunization: Secondary | ICD-10-CM

## 2018-06-24 ENCOUNTER — Other Ambulatory Visit: Payer: Self-pay | Admitting: Internal Medicine

## 2018-06-24 MED ORDER — DEXAMETHASONE 0.5 MG PO TABS: ORAL_TABLET | ORAL | 0 refills | Status: DC

## 2018-06-24 MED ORDER — AZITHROMYCIN 250 MG PO TABS: ORAL_TABLET | ORAL | 1 refills | Status: DC

## 2018-06-24 MED ORDER — IPRATROPIUM BROMIDE 0.06 % NA SOLN: 2.0000 | Freq: Three times a day (TID) | NASAL | 2 refills | Status: DC

## 2018-09-08 ENCOUNTER — Other Ambulatory Visit: Payer: Self-pay | Admitting: Adult Health

## 2018-09-08 MED ORDER — TRIAMCINOLONE ACETONIDE 0.1 % EX OINT: TOPICAL_OINTMENT | CUTANEOUS | 1 refills | Status: DC

## 2018-11-15 ENCOUNTER — Other Ambulatory Visit: Payer: Self-pay | Admitting: Internal Medicine

## 2018-11-15 DIAGNOSIS — Z1231 Encounter for screening mammogram for malignant neoplasm of breast: Secondary | ICD-10-CM

## 2018-12-08 ENCOUNTER — Other Ambulatory Visit: Payer: Self-pay

## 2018-12-08 ENCOUNTER — Ambulatory Visit (INDEPENDENT_AMBULATORY_CARE_PROVIDER_SITE_OTHER): Payer: 59

## 2018-12-08 DIAGNOSIS — Z23 Encounter for immunization: Secondary | ICD-10-CM | POA: Diagnosis not present

## 2018-12-08 NOTE — Progress Notes (Signed)
Patient presents to the office for an immunization of Tdap. Injection given in left deltoid. No complications.

## 2018-12-30 ENCOUNTER — Other Ambulatory Visit: Payer: Self-pay

## 2018-12-30 ENCOUNTER — Ambulatory Visit
Admission: RE | Admit: 2018-12-30 | Discharge: 2018-12-30 | Disposition: A | Discharge status: Home / Self Care | Payer: 59 | Source: Ambulatory Visit | Attending: Internal Medicine | Admitting: Internal Medicine

## 2018-12-30 DIAGNOSIS — Z1231 Encounter for screening mammogram for malignant neoplasm of breast: Secondary | ICD-10-CM

## 2019-02-07 ENCOUNTER — Ambulatory Visit (INDEPENDENT_AMBULATORY_CARE_PROVIDER_SITE_OTHER): Payer: 59

## 2019-02-07 ENCOUNTER — Other Ambulatory Visit: Payer: Self-pay

## 2019-02-07 VITALS — Temp 97.5°F

## 2019-02-07 DIAGNOSIS — Z23 Encounter for immunization: Secondary | ICD-10-CM | POA: Diagnosis not present

## 2019-04-23 ENCOUNTER — Other Ambulatory Visit: Payer: Self-pay | Admitting: Internal Medicine

## 2019-04-23 MED ORDER — DEXAMETHASONE 4 MG PO TABS: ORAL_TABLET | ORAL | 0 refills | Status: DC

## 2019-04-23 MED ORDER — PROMETHAZINE-DM 6.25-15 MG/5ML PO SYRP: ORAL_SOLUTION | ORAL | 1 refills | Status: DC

## 2019-04-23 MED ORDER — AZITHROMYCIN 250 MG PO TABS: ORAL_TABLET | ORAL | 1 refills | Status: DC

## 2019-05-01 ENCOUNTER — Other Ambulatory Visit: Payer: Self-pay | Admitting: Internal Medicine

## 2019-05-02 ENCOUNTER — Ambulatory Visit: Payer: 59 | Attending: Internal Medicine

## 2019-05-02 DIAGNOSIS — Z20822 Contact with and (suspected) exposure to covid-19: Secondary | ICD-10-CM

## 2019-05-04 ENCOUNTER — Other Ambulatory Visit: Payer: Self-pay | Admitting: Internal Medicine

## 2019-05-04 LAB — NOVEL CORONAVIRUS, NAA: SARS-CoV-2, NAA: DETECTED — AB

## 2019-05-04 MED ORDER — DEXAMETHASONE 4 MG PO TABS: ORAL_TABLET | ORAL | 0 refills | Status: DC

## 2019-05-04 MED ORDER — PROMETHAZINE-CODEINE 6.25-10 MG/5ML PO SYRP: ORAL_SOLUTION | ORAL | 1 refills | Status: DC

## 2019-05-04 MED ORDER — BENZONATATE 200 MG PO CAPS: ORAL_CAPSULE | ORAL | 1 refills | Status: DC

## 2019-05-06 ENCOUNTER — Other Ambulatory Visit: Payer: Self-pay | Admitting: Internal Medicine

## 2019-05-06 MED ORDER — PROCHLORPERAZINE MALEATE 5 MG PO TABS: ORAL_TABLET | ORAL | 0 refills | Status: DC

## 2019-05-06 MED ORDER — ONDANSETRON 8 MG PO TBDP: ORAL_TABLET | ORAL | 0 refills | Status: DC

## 2019-05-06 NOTE — Progress Notes (Signed)
  Patient is a 54 yo MWF recovering from Covid-19 with sx's predating to 12/26 and initially treated with a Z-pak, Decadron Taper & Phenergan/DM  [place] Respiratory sx's improved & resolved and now, patient is c/o nausea w/o c/o heartburn, reflux or other GI, or respiratory sx's.  Patient's initial exposure was reported to be from a sister-in-law 2 days (? Dec 22 or 23) before Xmas.  Rx's sent in for Zofran 8 mg SL & Compazine 5 mg - whichever insurance will cover

## 2019-05-20 ENCOUNTER — Other Ambulatory Visit: Payer: Self-pay | Admitting: Internal Medicine

## 2019-10-10 ENCOUNTER — Other Ambulatory Visit: Payer: Self-pay | Admitting: Adult Health Nurse Practitioner

## 2019-10-10 ENCOUNTER — Encounter: Payer: Self-pay | Admitting: Adult Health Nurse Practitioner

## 2019-10-10 DIAGNOSIS — H60392 Other infective otitis externa, left ear: Secondary | ICD-10-CM

## 2019-10-10 MED ORDER — OFLOXACIN 0.3 % OT SOLN: 10.0000 [drp] | Freq: Every day | OTIC | 0 refills | Status: DC

## 2019-10-10 MED ORDER — CETIRIZINE HCL 10 MG PO TABS: 10.0000 mg | ORAL_TABLET | Freq: Every day | ORAL | 1 refills | Status: DC

## 2019-10-11 ENCOUNTER — Other Ambulatory Visit: Payer: Self-pay

## 2019-10-11 ENCOUNTER — Other Ambulatory Visit: Payer: Self-pay | Admitting: Adult Health Nurse Practitioner

## 2019-10-11 DIAGNOSIS — H60391 Other infective otitis externa, right ear: Secondary | ICD-10-CM

## 2019-10-11 MED ORDER — CIPROFLOXACIN HCL 750 MG PO TABS: ORAL_TABLET | ORAL | 0 refills | Status: DC

## 2019-10-11 MED ORDER — CIPROFLOXACIN HCL 500 MG PO TABS: 500.0000 mg | ORAL_TABLET | Freq: Two times a day (BID) | ORAL | 0 refills | Status: AC

## 2019-10-26 ENCOUNTER — Other Ambulatory Visit: Payer: Self-pay | Admitting: Internal Medicine

## 2019-10-26 DIAGNOSIS — H6022 Malignant otitis externa, left ear: Secondary | ICD-10-CM

## 2019-10-26 MED ORDER — NEOMYCIN-POLYMYXIN-HC 3.5-10000-1 OT SUSP: OTIC | 1 refills | Status: DC

## 2019-10-26 MED ORDER — DOXYCYCLINE HYCLATE 100 MG PO CAPS: ORAL_CAPSULE | ORAL | 0 refills | Status: DC

## 2019-11-01 ENCOUNTER — Other Ambulatory Visit: Payer: Self-pay | Admitting: Adult Health Nurse Practitioner

## 2019-11-07 ENCOUNTER — Other Ambulatory Visit: Payer: Self-pay

## 2019-11-07 MED ORDER — FEXOFENADINE HCL 180 MG PO TABS: 180.0000 mg | ORAL_TABLET | Freq: Every day | ORAL | 3 refills | Status: DC

## 2020-02-22 ENCOUNTER — Ambulatory Visit: Payer: 59

## 2020-02-22 ENCOUNTER — Other Ambulatory Visit: Payer: Self-pay

## 2020-02-23 ENCOUNTER — Ambulatory Visit (INDEPENDENT_AMBULATORY_CARE_PROVIDER_SITE_OTHER): Payer: 59

## 2020-02-23 VITALS — Temp 98.7°F

## 2020-02-23 DIAGNOSIS — Z23 Encounter for immunization: Secondary | ICD-10-CM

## 2020-02-23 NOTE — Progress Notes (Signed)
Patient reports for LOW DOSE FLU vaccine which was given.  Patient had no concerns or questions at this time.

## 2020-04-13 ENCOUNTER — Other Ambulatory Visit: Payer: Self-pay | Admitting: Internal Medicine

## 2020-04-13 DIAGNOSIS — I10 Essential (primary) hypertension: Secondary | ICD-10-CM

## 2020-04-13 MED ORDER — BISOPROLOL FUMARATE 10 MG PO TABS: ORAL_TABLET | ORAL | 0 refills | Status: DC

## 2020-04-13 MED ORDER — OLMESARTAN MEDOXOMIL 40 MG PO TABS: ORAL_TABLET | ORAL | 0 refills | Status: DC

## 2020-06-04 ENCOUNTER — Other Ambulatory Visit: Payer: Self-pay | Admitting: Adult Health Nurse Practitioner

## 2020-06-04 DIAGNOSIS — M7072 Other bursitis of hip, left hip: Secondary | ICD-10-CM

## 2020-06-04 DIAGNOSIS — M7071 Other bursitis of hip, right hip: Secondary | ICD-10-CM

## 2020-06-04 DIAGNOSIS — M160 Bilateral primary osteoarthritis of hip: Secondary | ICD-10-CM

## 2020-06-05 ENCOUNTER — Encounter: Payer: Self-pay | Admitting: Adult Health Nurse Practitioner

## 2020-06-05 ENCOUNTER — Ambulatory Visit (INDEPENDENT_AMBULATORY_CARE_PROVIDER_SITE_OTHER): Payer: 59 | Admitting: Adult Health Nurse Practitioner

## 2020-06-05 ENCOUNTER — Other Ambulatory Visit: Payer: Self-pay

## 2020-06-05 ENCOUNTER — Other Ambulatory Visit: Payer: Self-pay | Admitting: Internal Medicine

## 2020-06-05 VITALS — BP 130/90 | HR 66 | Temp 97.7°F | Ht 65.0 in | Wt 145.0 lb

## 2020-06-05 DIAGNOSIS — M7072 Other bursitis of hip, left hip: Secondary | ICD-10-CM

## 2020-06-05 DIAGNOSIS — N951 Menopausal and female climacteric states: Secondary | ICD-10-CM

## 2020-06-05 DIAGNOSIS — R1032 Left lower quadrant pain: Secondary | ICD-10-CM

## 2020-06-05 DIAGNOSIS — M7071 Other bursitis of hip, right hip: Secondary | ICD-10-CM

## 2020-06-05 DIAGNOSIS — Z79899 Other long term (current) drug therapy: Secondary | ICD-10-CM

## 2020-06-05 DIAGNOSIS — M25551 Pain in right hip: Secondary | ICD-10-CM

## 2020-06-05 DIAGNOSIS — Z136 Encounter for screening for cardiovascular disorders: Secondary | ICD-10-CM | POA: Diagnosis not present

## 2020-06-05 DIAGNOSIS — I10 Essential (primary) hypertension: Secondary | ICD-10-CM

## 2020-06-05 DIAGNOSIS — Z0001 Encounter for general adult medical examination with abnormal findings: Secondary | ICD-10-CM

## 2020-06-05 DIAGNOSIS — M160 Bilateral primary osteoarthritis of hip: Secondary | ICD-10-CM

## 2020-06-05 DIAGNOSIS — Z1159 Encounter for screening for other viral diseases: Secondary | ICD-10-CM

## 2020-06-05 DIAGNOSIS — R1031 Right lower quadrant pain: Secondary | ICD-10-CM

## 2020-06-05 DIAGNOSIS — E559 Vitamin D deficiency, unspecified: Secondary | ICD-10-CM

## 2020-06-05 DIAGNOSIS — Z1321 Encounter for screening for nutritional disorder: Secondary | ICD-10-CM

## 2020-06-05 DIAGNOSIS — Z130 Encounter for screening for diseases of the blood and blood-forming organs and certain disorders involving the immune mechanism: Secondary | ICD-10-CM

## 2020-06-05 DIAGNOSIS — Z1329 Encounter for screening for other suspected endocrine disorder: Secondary | ICD-10-CM

## 2020-06-05 DIAGNOSIS — Z13220 Encounter for screening for lipoid disorders: Secondary | ICD-10-CM

## 2020-06-05 DIAGNOSIS — I447 Left bundle-branch block, unspecified: Secondary | ICD-10-CM

## 2020-06-05 DIAGNOSIS — M25552 Pain in left hip: Secondary | ICD-10-CM

## 2020-06-05 DIAGNOSIS — Z1389 Encounter for screening for other disorder: Secondary | ICD-10-CM

## 2020-06-05 DIAGNOSIS — K824 Cholesterolosis of gallbladder: Secondary | ICD-10-CM

## 2020-06-05 DIAGNOSIS — Z131 Encounter for screening for diabetes mellitus: Secondary | ICD-10-CM

## 2020-06-05 MED ORDER — MELOXICAM 15 MG PO TABS: ORAL_TABLET | ORAL | 0 refills | Status: DC

## 2020-06-05 NOTE — Progress Notes (Signed)
Complete Physical  Assessment and Plan:  Yvonne Bell was seen today for annual exam.  Diagnoses and all orders for this visit: Encounter for routine adult medical exam with abnormal findings Yearly Due for Mammogram Colonoscopy Pap  Bilateral hip pain -  Ambulatory referral to Orthopedics  Bilateral groin pain -     Cancel: Ambulatory referral to Orthopedics  Osteoarthritis of both hips, unspecified osteoarthritis type Bursitis of both hips, unspecified bursa Following with Dr Rip Harbour  Hypertension, essential Continue current medications: Olmesartan 40mg  at night and bisoprolol 10mg  in the morning Monitor blood pressure at home; call if consistently over 130/80 Continue DASH diet.   Reminder to go to the ER if any CP, SOB, nausea, dizziness, severe HA, changes vision/speech, left arm numbness and tingling and jaw pain.  Medication management Continued  Screening, ischemic heart disease EKG  Vitamin D deficiency Continue supplementation to maintain goal of 70-100 Taking Vitamin D  IU 15,000daily  vitamin D level   Gallbladder polyp 2016 last abdominal ultrasound Did not see general surgery regarding this Declines rescreening today, asymptomatic Continue to monitor  Screening for thyroid disorder -     TSH  Screening cholesterol level -     Lipid panel  Screening for diabetes mellitus -     Hemoglobin A1c  Need for hepatitis C screening test -     Hepatitis C antibody  Encounter for vitamin deficiency screening -     Vitamin B12  Screening, anemia, deficiency, iron -     Iron,Total/Total Iron Binding Cap  Screening for blood or protein in urine -     Urinalysis w microscopic + reflex cultur  Perimenopausal vasomotor symptoms -     Follicle stimulating hormone     We will contact you in 1-3 business days with lab results drawn today.  Follow up 3 for review of blood pressure log and medication management.  Call or return with new or worsening  symptoms as discussed in appointment.  May contact office via phone [phone] or [place].   Discussed med's effects and SE's. Screening labs and tests as requested with regular follow-up as recommended. Over 40 minutes of interview, exam, counseling, chart review, and complex, high level critical decision making was performed this visit.   HPI  55 y.o. female  presents for a complete physical and follow up for has Osteopenia; Allergic rhinitis; Vitamin D deficiency; and Gall bladder polyp on their problem list..  She recently was having hip pain s/p fall out of her attic around 02/2020.  Had OV with Dr Rip Harbour on 06/04/19 from referral from our office related to severe hip pain.  Her gait is antalgic on left.  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  She started a prednisone 5mg  12 day taper and physical therapy referral was placed.  She was also given tramadol 50mg  1-2 BID as needed for sever pain.  If no improvements was recommended she return with plans of MRI.   She is due for U/S recheck of GB polyp 2016 referral to general surgery , that was stable in , 2014 originally diagnosed in 2012. She has had NEG GGT labs and denies abdominal symptoms. Labs today.  Has tried phentermine in th past and this has caused adverse reaction, heart palpitations and toporimate resulted in brain fog and not able to tolerate.  She has tried diet and exercise. Is drinking 5bottles of water a day. Joined a gym but has  not been able to go.    DERM: Family history of melanoma   Her blood pressure has been controlled at home, today their BP is BP: 130/90 She does not workout. She denies chest pain, shortness of breath, dizziness.  She is check her blood pressure twice a day prior to medications.  She is ranging from 140's-150's over 60's-70's.  Sh is taking Olmesartan 40mg  every evening and bisoprolol in the mornings. Denies any side effects from  the medications. EKG: LBBB noted, last EKG 2016 Would like to do cardiology referral for further evaluation.   She is not on cholesterol medication and denies myalgias. Her cholesterol is at goal. The cholesterol last visit was:   Lab Results  Component Value Date   CHOL 186 09/05/2014   HDL 87 09/05/2014   LDLCALC 84 09/05/2014   TRIG 77 09/05/2014   CHOLHDL 2.1 09/05/2014    She has not been working on diet and exercise for prediabetes, she is on bASA, she is on ACE/ARB and denies polydipsia, polyuria, visual disturbances, vomiting and weight loss. Last A1C in the office was:  Lab Results  Component Value Date   HGBA1C 5.2 06/08/2014    Last GFR: Lab Results  Component Value Date   [facility] >89 06/08/2014   Lab Results  Component Value Date   GFRAA >89 06/08/2014    Patient is on Vitamin D supplement.   Lab Results  Component Value Date   VD25OH 86 06/08/2014      Current Medications:  Current Outpatient Medications on File Prior to Visit  Medication Sig Dispense Refill  . Cholecalciferol (VITAMIN D PO) Take 15,000 Int'l Units by mouth.    . ALPRAZolam (XANAX) 0.5 MG tablet Take 1/2 to 1 tablet 2 to 3 x /day as needed for Anxiety or Sleep (Patient not taking: Reported on 06/05/2020) 50 tablet 0  . bisoprolol (ZEBETA) 10 MG tablet Take      1/2 to 1 tablet         every Morning for BP 90 tablet 0  . fexofenadine (ALLEGRA) 180 MG tablet Take 1 tablet (180 mg total) by mouth daily. 90 tablet 3  . olmesartan (BENICAR) 40 MG tablet Take       1/2 to 1         Tablet        at Night       for BP 90 tablet 0   No current facility-administered medications on file prior to visit.   Allergies:  Allergies  Allergen Reactions  . Influenza Vaccines    Medical History:  She has Osteopenia; Allergic rhinitis; Vitamin D deficiency; and Gall bladder polyp on their problem list. Health Maintenance:   Immunization History  Administered Date(s) Administered  . Influenza Inj Mdck  Quad With Preservative 03/01/2017, 02/25/2018, 02/07/2019, 02/23/2020  . PFIZER(Purple Top)SARS-COV-2 Vaccination 11/24/2019, 12/15/2019, 12/15/2019  . PPD Test 06/08/2014  . Tdap 05/29/2008, 12/08/2018    Vaccinations: TD or Tdap: 11/2018  Influenza: 10/21 Pneumococcal: N/A Shingles: Zostavax/Shingrix: Discussed with patient   Preventative Care: Last colonoscopy: DUE Last mammogram: 12/2018, DUE Last pap smear/pelvic exam: DUE   DEXA:N/A Hep C screening ([id number]): Completed today    Names of Other Physician/Practitioners you currently use: 1. [facility] here for primary care 2. Eye Exam: 2022 3. Dental Exam 2021, schedule for 2022   Patient Care Team: Unk Pinto, MD as PCP - General (Internal Medicine) Maisie Fus, MD as Consulting Physician (Obstetrics  and Gynecology)  Surgical History:  She has a past surgical history that includes Cesarean section; Novasure ablation (2007); Wisdom tooth extraction; and Skin surgery (Left). Family History:  Herfamily history includes Aneurysm in her maternal grandmother; Breast cancer in her mother; Cancer in her maternal aunt and paternal uncle; Cancer (age of onset: 37) in her father; Colon polyps in her sister; Heart disease in her maternal grandfather; Hyperlipidemia in her maternal grandfather and mother; Hypertension in her mother; Osteopenia in her mother; Stroke in her maternal grandmother. Social History:  She reports that she quit smoking about 27 years ago. She has never used smokeless tobacco. She reports current alcohol use. She reports that she does not use drugs.  Review of Systems: ROS  Physical Exam: Estimated body mass index is 24.13 kg/m as calculated from the following:   Height as of this encounter: 5\' 5"  (1.651 m).   Weight as of this encounter: 145 lb (65.8 kg). BP 130/90   Pulse 66   Temp 97.7 F (36.5 C)   Ht 5\' 5"  (1.651 m)   Wt 145 lb (65.8 kg)   SpO2  98%   BMI 24.13 kg/m  General Appearance: Well nourished, in no apparent distress.  Eyes: PERRLA, EOMs, conjunctiva no swelling or erythema, normal fundi and vessels.  Sinuses: No Frontal/maxillary tenderness  ENT/Mouth: Ext aud canals clear, normal light reflex with TMs without erythema, bulging. Good dentition. No erythema, swelling, or exudate on post pharynx. Tonsils not swollen or erythematous. Hearing normal.  Neck: Supple, thyroid normal. No bruits  Respiratory: Respiratory effort normal, BS equal bilaterally without rales, rhonchi, wheezing or stridor.  Cardio: RRR without murmurs, rubs or gallops. Brisk peripheral pulses without edema.  Chest: symmetric, with normal excursions and percussion.  Breasts: Symmetric, without lumps, nipple discharge, retractions.  Abdomen: Soft, nontender, no guarding, rebound, hernias, masses, or organomegaly.  Lymphatics: Non tender without lymphadenopathy.  Genitourinary:  Musculoskeletal: Full ROM all peripheral extremities,5/5 strength, and normal gait.  Skin: Warm, dry without rashes, lesions, ecchymosis. Neuro: Cranial nerves intact, reflexes equal bilaterally. Normal muscle tone, no cerebellar symptoms. Sensation intact.  Psych: Awake and oriented X 3, normal affect, Insight and Judgment appropriate.    EKG: WNL no ST changes.   Garnet Sierras, Laqueta Jean, DNP [facility] 06/05/2020  10:32 AM

## 2020-06-05 NOTE — Patient Instructions (Signed)
  You are due for the following screening tests:     HOW TO [place]  7 a.m.-6:30 p.m., Monday 7 a.m.-5 p.m., Tuesday-Friday Schedule an appointment by calling [phone].  Also due for Colonoscopy - We will place a referral after your hip has improved.  Due for Cervical Cancer Screening, PAP.    You may take Tylenol 1,000mg  every 6-8 hours.  Do not take more than three times a day.   GENERAL HEALTH GOALS  Know what a healthy weight is for you (roughly BMI <25) and aim to maintain this  Aim for 7+ servings of fruits and vegetables daily  70-80+ fluid ounces of water or unsweet tea for healthy kidneys  Limit to max 1 drink of alcohol per day; avoid smoking/tobacco  Limit animal fats in diet for cholesterol and heart health - choose grass fed whenever available  Avoid highly processed foods, and foods high in saturated/trans fats  Aim for low stress - take time to unwind and care for your mental health  Aim for 150 min of moderate intensity exercise weekly for heart health, and weights twice weekly for bone health  Aim for 7-9 hours of sleep daily

## 2020-06-06 LAB — CBC WITH DIFFERENTIAL/PLATELET
Absolute Monocytes: 750 cells/uL (ref 200–950)
Basophils Absolute: 54 cells/uL (ref 0–200)
Basophils Relative: 0.4 %
Eosinophils Absolute: 13 cells/uL — ABNORMAL LOW (ref 15–500)
Eosinophils Relative: 0.1 %
HCT: 38.3 % (ref 35.0–45.0)
Hemoglobin: 13.2 g/dL (ref 11.7–15.5)
Lymphs Abs: 1581 cells/uL (ref 850–3900)
MCH: 35.5 pg — ABNORMAL HIGH (ref 27.0–33.0)
MCHC: 34.5 g/dL (ref 32.0–36.0)
MCV: 103 fL — ABNORMAL HIGH (ref 80.0–100.0)
MPV: 10.9 fL (ref 7.5–12.5)
Monocytes Relative: 5.6 %
Neutro Abs: 11001 cells/uL — ABNORMAL HIGH (ref 1500–7800)
Neutrophils Relative %: 82.1 %
Platelets: 334 10*3/uL (ref 140–400)
RBC: 3.72 10*6/uL — ABNORMAL LOW (ref 3.80–5.10)
RDW: 12 % (ref 11.0–15.0)
Total Lymphocyte: 11.8 %
WBC: 13.4 10*3/uL — ABNORMAL HIGH (ref 3.8–10.8)

## 2020-06-06 LAB — URINALYSIS W MICROSCOPIC + REFLEX CULTURE
Bacteria, UA: NONE SEEN /HPF
Bilirubin Urine: NEGATIVE
Glucose, UA: NEGATIVE
Hgb urine dipstick: NEGATIVE
Hyaline Cast: NONE SEEN /LPF
Ketones, ur: NEGATIVE
Leukocyte Esterase: NEGATIVE
Nitrites, Initial: NEGATIVE
Protein, ur: NEGATIVE
RBC / HPF: NONE SEEN /HPF (ref 0–2)
Specific Gravity, Urine: 1.004 (ref 1.001–1.03)
Squamous Epithelial / HPF: NONE SEEN /HPF (ref <=5)
WBC, UA: NONE SEEN /HPF (ref 0–5)
pH: 6 (ref 5.0–8.0)

## 2020-06-06 LAB — HEMOGLOBIN A1C
Hgb A1c MFr Bld: 5.1 % of total Hgb (ref <5.7)
Mean Plasma Glucose: 100 mg/dL
eAG (mmol/L): 5.5 mmol/L

## 2020-06-06 LAB — COMPLETE METABOLIC PANEL WITH GFR
AG Ratio: 1.8 (calc) (ref 1.0–2.5)
ALT: 67 U/L — ABNORMAL HIGH (ref 6–29)
AST: 45 U/L — ABNORMAL HIGH (ref 10–35)
Albumin: 4.6 g/dL (ref 3.6–5.1)
Alkaline phosphatase (APISO): 141 U/L (ref 37–153)
BUN: 15 mg/dL (ref 7–25)
CO2: 26 mmol/L (ref 20–32)
Calcium: 10 mg/dL (ref 8.6–10.4)
Chloride: 101 mmol/L (ref 98–110)
Creat: 0.8 mg/dL (ref 0.50–1.05)
GFR, Est African American: 96 mL/min/{1.73_m2} (ref >=60)
GFR, Est Non African American: 83 mL/min/{1.73_m2} (ref >=60)
Globulin: 2.6 g/dL (calc) (ref 1.9–3.7)
Glucose, Bld: 100 mg/dL — ABNORMAL HIGH (ref 65–99)
Potassium: 4.4 mmol/L (ref 3.5–5.3)
Sodium: 138 mmol/L (ref 135–146)
Total Bilirubin: 0.4 mg/dL (ref 0.2–1.2)
Total Protein: 7.2 g/dL (ref 6.1–8.1)

## 2020-06-06 LAB — LIPID PANEL
Cholesterol: 261 mg/dL — ABNORMAL HIGH (ref <200)
HDL: 94 mg/dL (ref >=50)
LDL Cholesterol (Calc): 144 mg/dL (calc) — ABNORMAL HIGH
Non-HDL Cholesterol (Calc): 167 mg/dL (calc) — ABNORMAL HIGH (ref <130)
Total CHOL/HDL Ratio: 2.8 (calc) (ref <5.0)
Triglycerides: 109 mg/dL (ref <150)

## 2020-06-06 LAB — VITAMIN B12: Vitamin B-12: 476 pg/mL (ref 200–1100)

## 2020-06-06 LAB — IRON, TOTAL/TOTAL IRON BINDING CAP
%SAT: 26 % (calc) (ref 16–45)
Iron: 97 ug/dL (ref 45–160)
TIBC: 378 mcg/dL (calc) (ref 250–450)

## 2020-06-06 LAB — NO CULTURE INDICATED

## 2020-06-06 LAB — TSH: TSH: 1.57 mIU/L

## 2020-06-06 LAB — VITAMIN D 25 HYDROXY (VIT D DEFICIENCY, FRACTURES): Vit D, 25-Hydroxy: 57 ng/mL (ref 30–100)

## 2020-06-06 LAB — HEPATITIS C ANTIBODY
Hepatitis C Ab: NONREACTIVE
SIGNAL TO CUT-OFF: 0 (ref <1.00)

## 2020-06-07 ENCOUNTER — Encounter: Payer: Self-pay | Admitting: Adult Health Nurse Practitioner

## 2020-06-14 ENCOUNTER — Ambulatory Visit: Payer: 59 | Admitting: Internal Medicine

## 2020-06-14 ENCOUNTER — Other Ambulatory Visit: Payer: Self-pay

## 2020-06-14 ENCOUNTER — Encounter: Payer: Self-pay | Admitting: Internal Medicine

## 2020-06-14 VITALS — BP 170/80 | HR 67 | Ht 65.0 in | Wt 143.0 lb

## 2020-06-14 DIAGNOSIS — I447 Left bundle-branch block, unspecified: Secondary | ICD-10-CM | POA: Diagnosis not present

## 2020-06-14 DIAGNOSIS — I10 Essential (primary) hypertension: Secondary | ICD-10-CM | POA: Diagnosis not present

## 2020-06-14 MED ORDER — CHLORTHALIDONE 25 MG PO TABS: 12.5000 mg | ORAL_TABLET | Freq: Every day | ORAL | 6 refills | Status: DC

## 2020-06-14 NOTE — Patient Instructions (Signed)
Medication Instructions:  START CHLORTHALIDONE 12.5 MG EVERY DAY *If you need a refill on your cardiac medications before your next appointment, please call your pharmacy*   Lab Work: BMET [place] AT PCP If you have labs (blood work) drawn today and your tests are completely normal, you will receive your results only by: Marland Kitchen MyChart Message (if you have MyChart) OR . A paper copy in the mail If you have any lab test that is abnormal or we need to change your treatment, we will call you to review the results.   Testing/Procedures: Your physician has requested that you have an echocardiogram. Echocardiography is a painless test that uses sound waves to create images of your heart. It provides your doctor with information about the size and shape of your heart and how well your heart's chambers and valves are working. This procedure takes approximately one hour. There are no restrictions for this procedure.    Follow-Up: At [facility], you and your health needs are our priority.  As part of our continuing mission to provide you with exceptional heart care, we have created designated Provider Care Teams.  These Care Teams include your primary Cardiologist (physician) and Advanced Practice Providers (APPs -  Physician Assistants and Nurse Practitioners) who all work together to provide you with the care you need, when you need it.  We recommend signing up for the patient portal called "MyChart".  Sign up information is provided on this After Visit Summary.  MyChart is used to connect with patients for Virtual Visits (Telemedicine).  Patients are able to view lab/test results, encounter notes, upcoming appointments, etc.  Non-urgent messages can be sent to your provider as well.   To learn more about what you can do with MyChart, go to [email or web address].    Your next appointment:      The format for your next appointment:    Provider:   PENDING TEST RESULTS    Other  Instructions

## 2020-06-14 NOTE — Progress Notes (Signed)
Cardiology Office Note   Date:  06/14/2020   ID:  ALONZO MCLENNAN, DOB 21-Jun-1965, MRN [id number]  PCP:  Lucky Cowboy, MD  Cardiologist:   Dietrich Pates, MD   Patient referred for evaluation of HTN      History of Present Illness: NABEELA RODA is a 55 y.o. female referred for abnormal EKG (LBBB) and HTN  The pt has a hx of HTN  She has been on medical rx   Recently BP has been elevated     She is followed by Dr Oneta Rack   Currently on Zebeta and Benicar   Doses have been incresaed recently but   BP has remained high     Pt was seen in clinic   EKG done on 2/9 which showed LBBB  THis was new   The pt denies any chest pain.  She says her breathing is OK   She is troubled by significant low back pain   Seen by chiropractor as well as ortho   Was taking NSAIDS but now off   On prednisone  BP log from home   BP 140s to 160s /   AVerage 150s/        Current Meds  Medication Sig  . ALPRAZolam (XANAX) 0.5 MG tablet Take 1/2 to 1 tablet 2 to 3 x /day as needed for Anxiety or Sleep  . bisoprolol (ZEBETA) 10 MG tablet Take      1/2 to 1 tablet         every Morning for BP  . Cholecalciferol (VITAMIN D PO) Take 15,000 Int'l Units by mouth.  . fexofenadine (ALLEGRA) 180 MG tablet Take 1 tablet (180 mg total) by mouth daily.  . meloxicam (MOBIC) 15 MG tablet Take  1 tablet  Daily  with Food for Pain & Inflammation  . olmesartan (BENICAR) 40 MG tablet Take       1/2 to 1         Tablet        at Night       for BP  . predniSONE (STERAPRED UNI-PAK 48 TAB) 5 MG (48) TBPK tablet Take by mouth.  . traMADol (ULTRAM) 50 MG tablet Take 50 mg by mouth every 8 (eight) hours as needed.     Allergies:   Influenza vaccines   Past Medical History:  Diagnosis Date  . Allergic rhinitis   . Gall bladder polyp 2012  . Gallbladder polyp   . Hyperlipidemia   . Osteopenia   . Vitamin D deficiency     Past Surgical History:  Procedure Laterality Date  . CESAREAN SECTION    . NOVASURE ABLATION   2007  . SKIN SURGERY Left    Moderate atypia, abdomen  . WISDOM TOOTH EXTRACTION       Social History:  The patient  reports that she quit smoking about 27 years ago. She has never used smokeless tobacco. She reports current alcohol use. She reports that she does not use drugs.   Family History:  The patient's family history includes Aneurysm in her maternal grandmother; Breast cancer in her mother; Cancer in her maternal aunt and paternal uncle; Cancer (age of onset: 72) in her father; Colon polyps in her sister; Heart disease in her maternal grandfather; Hyperlipidemia in her maternal grandfather and mother; Hypertension in her mother; Osteopenia in her mother; Stroke in her maternal grandmother.    ROS:  Please see the history of present illness. All other systems are reviewed  and  Negative to the above problem except as noted.    PHYSICAL EXAM: VS:  BP (!) 170/80 (BP Location: Left Arm, Patient Position: Sitting, Cuff Size: Normal)   Pulse 67   Ht 5\' 5"  (1.651 m)   Wt 143 lb (64.9 kg)   SpO2 99%   BMI 23.80 kg/m   GEN: Well nourished, well developed, in no acute distress  HEENT: normal  Neck: no JVD, carotid bruits, Cardiac: RRR; no murmurs,   No LE  edema  Respiratory:  clear to auscultation bilaterally, GI: soft, nontender, nondistended, + BS  No hepatomegaly  MS: no deformity Moving all extremities   Skin: warm and dry, no rash Neuro:  Strength and sensation are intact Psych: euthymic mood, full affect   EKG:  EKG is not ordered today.  On 06/05/20  SB    LBBB This is different from EKG in 2016   Lipid Panel    Component Value Date/Time   CHOL 261 (H) 06/05/2020 1415   TRIG 109 06/05/2020 1415   HDL 94 06/05/2020 1415   CHOLHDL 2.8 06/05/2020 1415   VLDL 15 09/05/2014 1024   LDLCALC 144 (H) 06/05/2020 1415      Wt Readings from Last 3 Encounters:  06/14/20 143 lb (64.9 kg)  06/05/20 145 lb (65.8 kg)  09/05/14 125 lb (56.7 kg)      ASSESSMENT AND  PLAN:  1.  HTN  PT with hypertension which is not optimally controlled  Pain from back may be contribulting   Pt also on steroids.    I would recomm keeping on current regimen  WOuld recomm adding chlorathalidone to regimen  12.5 mg   FOllow up electrolytes (have them drawn where she works)   Continue to keep log of BP s  Will follow up with pt  2  Hx LBBB  Pt asymptomatic   Will set up for echo   If LVEF normal  Would set up for lexiscan myovue  3  HCM  Discussed diet  LDL 144   Watch fat and sugar intake    WIll follow over time    Follow up based on results of testing and response to changes    Current medicines are reviewed at length with the patient today.  The patient does not have concerns regarding medicines.  Signed, Dietrich Pates, MD  06/14/2020 2:11 PM    [facility] [address] [place], [place], [place]  [address] Phone: [phone]; Fax: [phone]

## 2020-06-17 ENCOUNTER — Other Ambulatory Visit: Payer: Self-pay | Admitting: Internal Medicine

## 2020-06-25 ENCOUNTER — Encounter: Payer: 59 | Admitting: Adult Health Nurse Practitioner

## 2020-06-26 ENCOUNTER — Other Ambulatory Visit: Payer: 59

## 2020-06-26 ENCOUNTER — Other Ambulatory Visit: Payer: Self-pay | Admitting: Internal Medicine

## 2020-06-26 ENCOUNTER — Other Ambulatory Visit: Payer: Self-pay

## 2020-06-26 DIAGNOSIS — I10 Essential (primary) hypertension: Secondary | ICD-10-CM

## 2020-06-26 DIAGNOSIS — Z79899 Other long term (current) drug therapy: Secondary | ICD-10-CM

## 2020-06-27 ENCOUNTER — Other Ambulatory Visit: Payer: Self-pay | Admitting: Internal Medicine

## 2020-06-27 DIAGNOSIS — Z1231 Encounter for screening mammogram for malignant neoplasm of breast: Secondary | ICD-10-CM

## 2020-06-27 LAB — BASIC METABOLIC PANEL WITH GFR
BUN/Creatinine Ratio: 30 (calc) — ABNORMAL HIGH (ref 6–22)
BUN: 28 mg/dL — ABNORMAL HIGH (ref 7–25)
CO2: 26 mmol/L (ref 20–32)
Calcium: 10.1 mg/dL (ref 8.6–10.4)
Chloride: 89 mmol/L — ABNORMAL LOW (ref 98–110)
Creat: 0.93 mg/dL (ref 0.50–1.05)
GFR, Est African American: 80 mL/min/{1.73_m2} (ref >=60)
GFR, Est Non African American: 69 mL/min/{1.73_m2} (ref >=60)
Glucose, Bld: 77 mg/dL (ref 65–99)
Potassium: 5.2 mmol/L (ref 3.5–5.3)
Sodium: 128 mmol/L — ABNORMAL LOW (ref 135–146)

## 2020-06-27 NOTE — Progress Notes (Signed)
Yvonne Bell's labs   you requested

## 2020-06-28 ENCOUNTER — Other Ambulatory Visit (INDEPENDENT_AMBULATORY_CARE_PROVIDER_SITE_OTHER): Payer: 59 | Admitting: Internal Medicine

## 2020-06-28 ENCOUNTER — Encounter: Payer: Self-pay | Admitting: Internal Medicine

## 2020-06-28 DIAGNOSIS — M76899 Other specified enthesopathies of unspecified lower limb, excluding foot: Secondary | ICD-10-CM | POA: Diagnosis not present

## 2020-06-28 MED ORDER — PREGABALIN 50 MG PO CAPS: ORAL_CAPSULE | ORAL | 0 refills | Status: DC

## 2020-06-28 MED ORDER — DEXAMETHASONE 4 MG PO TABS: ORAL_TABLET | ORAL | 0 refills | Status: DC

## 2020-06-28 NOTE — Progress Notes (Signed)
History of Present Illness:   Fri March 4 , 2022  9:00 pm     Patient is a very nice 55 yo MWF  Seen about 3-4 weeks ago of bilateral "hip pain" & was referred to orthopedics who did X-rays & dx'd her with "arthritis and treated her with a pulse tapering dose of medrol. Her sx's at the time was primarily Rt groin pain. She improved on the steroids and after finishing, she started taking Tylenol 2 x /day and Meloxicam. She remained well for about a week and then over the last week started developing bilateral groin pains  Lt>>Rt. Pain is described as "sharp" and "catching " associated with sit/stand maneuvers or walking. Over the last 24 hours intensity of pain increased to the level she was unable to stand or walk & was unable to walk and was unable to go to work today. She called tonight & is seen on an emergent basis for evaluation. She's brought in by wheelchair.   Medications  .  dexamethasone (DECADRON) 4 MG tablet, Take 1 tab 3 x day for 5 days, then 2 x day for 5 days, then 1 tab Daily for  days  Then 1 tablet every other day ( even days )  .  bisoprolol (ZEBETA) 10 MG tablet, Take      1/2 to 1 tablet         every Morning for BP .  chlorthalidone (HYGROTON) 25 MG tablet, Take 0.5 tablets (12.5 mg total) by mouth daily. Marland Kitchen  olmesartan (BENICAR) 40 MG tablet, Take       1/2 to 1         Tablet        at Night       for BP .  fexofenadine (ALLEGRA) 180 MG tablet, Take 1 tablet (180 mg total) by mouth daily. .  meloxicam (MOBIC) 15 MG tablet, Take  1 tablet  Daily  with Food for Pain & Inflammation .  traMADol (ULTRAM) 50 MG tablet, Take 50 mg by mouth every 8 (eight) hours as needed.  .  pregabalin (LYRICA) 50 MG capsule, Take 1 to 2 capsules 3 x /day  (every 4 to 6 hours) as needed for Pain .  ALPRAZolam (XANAX) 0.5 MG tablet, Take 1/2 to 1 tablet 2 to 3 x /day as needed for Anxiety or Sleep .  VITAMIN D Take 15,000 Int'l Units by mouth.  Problem list She has Osteopenia; Allergic  rhinitis; Vitamin D deficiency; and Gall bladder polyp on their problem list.   Observations/Objective:  BP (!) 155/95   Pulse 90   Resp 16   Appears tearful, very uncomfortable and in pain. HEENT - WNL. Neck - supple.  Chest - Clear equal BS. Cor - Nl HS. RRR w/o sig MGR. PP 1(+). No edema.  MS-  Bilateral  discomfort in the groin(s) with hip internal /external rotation & abduction (Lt>>Rt)           Moderately severe point tenderness to palpitation at insertion of the hip adductors :            Pectineus, Adductor Longus & Gracilis - Lt >> Rt.   Neuro -  Nl w/o focal abnormalities.  Assessment and Plan:   1. Adductor tendonitis  - Given Decadron 4 mg x 3 tabs = 12 mg now & 2 tabs for am.   Then tomorrow start a 5 week taper   - DECADRON 4 MG ; Take 1 tab  3 x day for 5 days, then 2 x day for 5 days, then 1 tab  Daily for  days  Then 1 tablet every other day ( even days )  Disp: 40 tabs  ====================================================================  - Given Lyrica 50 mg x 2 caps now & # 6 to go to take 1-2  q4-6 hr as needed for pain  Then tomorrow take as below  - LYRICA 50 MG caps ; Take 1 to 2 capsules 3 x /day  (every 4-6 hrs) as needed for Pain  Disp: 90  caps  - Discussed meds & side-effects   Follow Up Instructions:       I discussed the assessment and treatment plan with the patient & husband. They were provided an opportunity to ask questions and all were answered. The patient agreed with the plan and demonstrated an understanding of the instructions.       The patient & husband were advised to call back or seek an in-person evaluation if the symptoms worsen or if the condition fails to improve as anticipated.      Between 30-40 minutes of counseling, chart review, and critical decision making was performed.   Kirtland Bouchard, MD

## 2020-07-01 ENCOUNTER — Other Ambulatory Visit: Payer: Self-pay | Admitting: Internal Medicine

## 2020-07-02 ENCOUNTER — Other Ambulatory Visit: Payer: Self-pay | Admitting: Internal Medicine

## 2020-07-02 DIAGNOSIS — B029 Zoster without complications: Secondary | ICD-10-CM | POA: Insufficient documentation

## 2020-07-02 MED ORDER — VALACYCLOVIR HCL 1 G PO TABS: ORAL_TABLET | ORAL | 0 refills | Status: DC

## 2020-07-04 ENCOUNTER — Other Ambulatory Visit: Payer: Self-pay | Admitting: *Deleted

## 2020-07-04 DIAGNOSIS — I10 Essential (primary) hypertension: Secondary | ICD-10-CM

## 2020-07-06 ENCOUNTER — Other Ambulatory Visit: Payer: Self-pay | Admitting: Internal Medicine

## 2020-07-06 DIAGNOSIS — I10 Essential (primary) hypertension: Secondary | ICD-10-CM

## 2020-07-08 ENCOUNTER — Telehealth: Payer: Self-pay | Admitting: Internal Medicine

## 2020-07-08 NOTE — Telephone Encounter (Signed)
Yvonne Bell is calling due to having to reschedule her Echo due to currently having shingles. She also canceled her labs and requested an order be sent to Dr. Melford Aase at [facility] to have them performed there. She is requesting a callback with confirmation on when they should be performed due to rescheduling. Dr. Idell Pickles fax number is [phone]. Please advise.

## 2020-07-09 ENCOUNTER — Encounter: Payer: 59 | Admitting: Adult Health Nurse Practitioner

## 2020-07-09 NOTE — Addendum Note (Signed)
Addended by: Rodman Key on: 07/09/2020 12:33 PM   Modules accepted: Orders

## 2020-07-09 NOTE — Telephone Encounter (Signed)
Returned call to patient. Left detailed VM (DPR) Echo has already been rescheduled to 08/23/20.   Pt can repeat labs at her convenience at PCP office over the next couple weeks when she recovers from Shingles.

## 2020-07-12 ENCOUNTER — Other Ambulatory Visit: Payer: 59

## 2020-07-12 ENCOUNTER — Other Ambulatory Visit (HOSPITAL_COMMUNITY): Payer: 59

## 2020-07-12 ENCOUNTER — Other Ambulatory Visit: Payer: Self-pay | Admitting: Internal Medicine

## 2020-07-18 ENCOUNTER — Other Ambulatory Visit: Payer: Self-pay | Admitting: Adult Health

## 2020-07-18 MED ORDER — LOSARTAN POTASSIUM 25 MG PO TABS: 25.0000 mg | ORAL_TABLET | Freq: Every day | ORAL | 1 refills | Status: DC

## 2020-07-20 ENCOUNTER — Other Ambulatory Visit: Payer: Self-pay | Admitting: Internal Medicine

## 2020-07-20 DIAGNOSIS — M76899 Other specified enthesopathies of unspecified lower limb, excluding foot: Secondary | ICD-10-CM

## 2020-07-20 MED ORDER — PREGABALIN 100 MG PO CAPS: ORAL_CAPSULE | ORAL | 0 refills | Status: DC

## 2020-07-20 MED ORDER — PREGABALIN 50 MG PO CAPS: ORAL_CAPSULE | ORAL | 0 refills | Status: DC

## 2020-07-23 ENCOUNTER — Other Ambulatory Visit: Payer: Self-pay

## 2020-07-23 ENCOUNTER — Other Ambulatory Visit: Payer: 59

## 2020-07-23 ENCOUNTER — Other Ambulatory Visit: Payer: Self-pay | Admitting: Internal Medicine

## 2020-07-23 DIAGNOSIS — E871 Hypo-osmolality and hyponatremia: Secondary | ICD-10-CM

## 2020-07-23 DIAGNOSIS — I10 Essential (primary) hypertension: Secondary | ICD-10-CM

## 2020-07-23 DIAGNOSIS — Z79899 Other long term (current) drug therapy: Secondary | ICD-10-CM

## 2020-07-23 DIAGNOSIS — R7989 Other specified abnormal findings of blood chemistry: Secondary | ICD-10-CM

## 2020-07-23 DIAGNOSIS — R945 Abnormal results of liver function studies: Secondary | ICD-10-CM

## 2020-07-23 LAB — COMPLETE METABOLIC PANEL WITH GFR
AG Ratio: 1.8 (calc) (ref 1.0–2.5)
ALT: 67 U/L — ABNORMAL HIGH (ref 6–29)
AST: 33 U/L (ref 10–35)
Albumin: 4.3 g/dL (ref 3.6–5.1)
Alkaline phosphatase (APISO): 88 U/L (ref 37–153)
BUN: 17 mg/dL (ref 7–25)
CO2: 29 mmol/L (ref 20–32)
Calcium: 10 mg/dL (ref 8.6–10.4)
Chloride: 98 mmol/L (ref 98–110)
Creat: 0.92 mg/dL (ref 0.50–1.05)
GFR, Est African American: 81 mL/min/{1.73_m2} (ref >=60)
GFR, Est Non African American: 70 mL/min/{1.73_m2} (ref >=60)
Globulin: 2.4 g/dL (calc) (ref 1.9–3.7)
Glucose, Bld: 88 mg/dL (ref 65–99)
Potassium: 4 mmol/L (ref 3.5–5.3)
Sodium: 136 mmol/L (ref 135–146)
Total Bilirubin: 0.4 mg/dL (ref 0.2–1.2)
Total Protein: 6.7 g/dL (ref 6.1–8.1)

## 2020-07-23 LAB — GAMMA GT: GGT: 134 U/L — ABNORMAL HIGH (ref 3–70)

## 2020-07-24 NOTE — Progress Notes (Signed)
============================================================ ============================================================  -    Kidney functions back to Normal & sodium is Normal now off fluid pill  ============================================================ ============================================================  -  One liver enzyme (ALT) is still elevated and a more sensitive liver enzyme is             your GGT = 134  ! which is very elevated (normal is less than 70  !  )   - This could be due to Fatty Liver or Alcohol , so it's absolutely important to                             work on losing weight & Absolute Avoidance of ALL Alcohol !  - So you don't end up with Cirrhosis of the Liver ============================================================ ============================================================  -

## 2020-07-25 ENCOUNTER — Encounter: Payer: Self-pay | Admitting: *Deleted

## 2020-08-14 ENCOUNTER — Other Ambulatory Visit: Payer: Self-pay | Admitting: Internal Medicine

## 2020-08-14 DIAGNOSIS — M76899 Other specified enthesopathies of unspecified lower limb, excluding foot: Secondary | ICD-10-CM

## 2020-08-14 DIAGNOSIS — R1031 Right lower quadrant pain: Secondary | ICD-10-CM

## 2020-08-14 MED ORDER — DULOXETINE HCL 30 MG PO CPEP: ORAL_CAPSULE | ORAL | 3 refills | Status: DC

## 2020-08-14 MED ORDER — DULOXETINE HCL 60 MG PO CPEP: ORAL_CAPSULE | ORAL | 11 refills | Status: DC

## 2020-08-14 MED ORDER — DULOXETINE HCL 30 MG PO CPEP: ORAL_CAPSULE | ORAL | 1 refills | Status: DC

## 2020-08-22 ENCOUNTER — Ambulatory Visit: Payer: 59

## 2020-08-23 ENCOUNTER — Other Ambulatory Visit (HOSPITAL_COMMUNITY): Payer: 59

## 2020-08-30 ENCOUNTER — Other Ambulatory Visit: Payer: Self-pay

## 2020-08-30 ENCOUNTER — Ambulatory Visit (HOSPITAL_COMMUNITY): Payer: 59 | Attending: Internal Medicine

## 2020-08-30 DIAGNOSIS — I447 Left bundle-branch block, unspecified: Secondary | ICD-10-CM | POA: Insufficient documentation

## 2020-08-30 DIAGNOSIS — I10 Essential (primary) hypertension: Secondary | ICD-10-CM | POA: Diagnosis not present

## 2020-08-30 LAB — ECHOCARDIOGRAM COMPLETE
Area-P 1/2: 4.06 cm2
S' Lateral: 3 cm

## 2020-09-05 ENCOUNTER — Encounter: Payer: Self-pay | Admitting: Internal Medicine

## 2020-09-09 ENCOUNTER — Telehealth: Payer: Self-pay | Admitting: Internal Medicine

## 2020-09-09 DIAGNOSIS — I447 Left bundle-branch block, unspecified: Secondary | ICD-10-CM

## 2020-09-09 NOTE — Telephone Encounter (Signed)
New message:     Patient calling stating that some one called concerning some results.

## 2020-09-10 NOTE — Telephone Encounter (Signed)
Pt is returning call in regards to her results from yesterday. Please advise

## 2020-09-10 NOTE — Telephone Encounter (Signed)
Fay Records, MD  08/31/2020 10:47 PM EDT      Echo shows normal LV and RV systolic function Normal valve function With LBBB on ekg I would recomm Lexiscan Myovue to r/o ischemia   __________________________________________ Damaris Schooner with patient.  Reviewed echo results and recommendations.  Reviewed instructions for lexiscan.  Pt aware she will be contacted to schedule.

## 2020-09-10 NOTE — Addendum Note (Signed)
Addended by: Fay Records on: 09/10/2020 05:58 PM   Modules accepted: Orders

## 2020-09-10 NOTE — Addendum Note (Signed)
Addended by: Rodman Key on: 09/10/2020 11:51 AM   Modules accepted: Orders

## 2020-09-11 ENCOUNTER — Telehealth (HOSPITAL_COMMUNITY): Payer: Self-pay | Admitting: *Deleted

## 2020-09-11 NOTE — Telephone Encounter (Signed)
Patient given detailed instructions per Myocardial Perfusion Study Information Sheet for the test on 09/13/20 at 7:30. Patient notified to arrive 15 minutes early and that it is imperative to arrive on time for appointment to keep from having the test rescheduled.  If you need to cancel or reschedule your appointment, please call the office within 24 hours of your appointment. . Patient verbalized understanding.Yvonne Bell

## 2020-09-13 ENCOUNTER — Other Ambulatory Visit: Payer: Self-pay

## 2020-09-13 ENCOUNTER — Ambulatory Visit (HOSPITAL_COMMUNITY): Payer: 59 | Attending: Cardiology

## 2020-09-13 DIAGNOSIS — I447 Left bundle-branch block, unspecified: Secondary | ICD-10-CM | POA: Insufficient documentation

## 2020-09-13 LAB — MYOCARDIAL PERFUSION IMAGING
LV dias vol: 90 mL (ref 46–106)
LV sys vol: 46 mL
Peak HR: 102 {beats}/min
Rest HR: 72 {beats}/min
SDS: 0
SRS: 2
SSS: 2
TID: 1.03

## 2020-09-13 MED ORDER — TECHNETIUM TC 99M TETROFOSMIN IV KIT
30.8000 | PACK | Freq: Once | INTRAVENOUS | Status: AC | PRN
  Administered 2020-09-13: 30.8 via INTRAVENOUS
  Filled 2020-09-13: qty 31

## 2020-09-13 MED ORDER — REGADENOSON 0.4 MG/5ML IV SOLN
0.4000 mg | Freq: Once | INTRAVENOUS | Status: AC
  Administered 2020-09-13: 0.4 mg via INTRAVENOUS

## 2020-09-13 MED ORDER — TECHNETIUM TC 99M TETROFOSMIN IV KIT
10.3000 | PACK | Freq: Once | INTRAVENOUS | Status: AC | PRN
  Administered 2020-09-13: 10.3 via INTRAVENOUS
  Filled 2020-09-13: qty 11

## 2020-09-19 ENCOUNTER — Telehealth: Payer: Self-pay | Admitting: Internal Medicine

## 2020-09-19 NOTE — Telephone Encounter (Signed)
Patient Name: Yvonne Bell  DOB: [birth year]  MRN: [id number]   Primary Cardiologist: None  Chart reviewed as part of pre-operative protocol coverage. 55 year old female who was evaluated by Dr. Tenny Craw in 05/2020 for LBBB. Echo showed preserved LVSF. Lexiscan MPI showed a medium defect of moderate severity in the mid anterior, mid inferoseptal, apical anterior and apex location suggestive of possible shifting breast artifact vs less likely LBBB artifact. There was no significant reversible ischemia. Overall, this was a low risk scan.   We have received a request to provide clearance prior to planned L hip arthroplasty on 10/18/2020.   Dr. Tenny Craw, from a cardiac perspective, if she is doing well, do you recommend any further workup prior to surgery? Anesthesia will be spinal. Please route reply to the preop pool (p cv div preop).    Eula Listen, PA-C 09/19/2020, 4:29 PM

## 2020-09-19 NOTE — Telephone Encounter (Signed)
[facility] Pre-operative Risk Assessment    Patient Name: Yvonne Bell  DOB: [birth year]  MRN: [id number]   HEARTCARE STAFF: - Please ensure there is not already an duplicate clearance open for this procedure. - Under Visit Info/Reason for Call, type in Other and utilize the format Clearance MM/DD/YY or Clearance TBD. Do not use dashes or single digits. - If request is for dental extraction, please clarify the # of teeth to be extracted.  Request for surgical clearance:  1. What type of surgery is being performed? L Hip replacement   2. When is this surgery scheduled? 10/18/20  3. What type of clearance is required (medical clearance vs. Pharmacy clearance to hold med vs. Both)? Both  4. Are there any medications that need to be held prior to surgery and how long? TBD by Cardiology (Pt is not sure )  5. Practice name and name of physician performing surgery? Dr. Gean Birchwood, [facility]   6. What is the office phone number? [phone]   7.   What is the office fax number? [phone]  8.   Anesthesia type (None, local, MAC, general) ? spinal   Jimmey Ralph 09/19/2020, 4:11 PM  _________________________________________________________________   (provider comments below)

## 2020-09-20 ENCOUNTER — Ambulatory Visit
Admission: RE | Admit: 2020-09-20 | Discharge: 2020-09-20 | Disposition: A | Discharge status: Home / Self Care | Payer: 59 | Source: Ambulatory Visit | Attending: Internal Medicine | Admitting: Internal Medicine

## 2020-09-20 ENCOUNTER — Other Ambulatory Visit: Payer: Self-pay

## 2020-09-20 DIAGNOSIS — Z1231 Encounter for screening mammogram for malignant neoplasm of breast: Secondary | ICD-10-CM

## 2020-09-20 NOTE — Telephone Encounter (Signed)
Review of echo and myoview pt without ischemia   LVEF normal   Probable soft tissue attenuation in myoview. Low risk   OK to proceed without further testing

## 2020-09-20 NOTE — Telephone Encounter (Signed)
Left voice mail to call back 

## 2020-09-20 NOTE — Telephone Encounter (Signed)
Name: Yvonne Bell  DOB: September 30, 1965  MRN: [id number]   Primary Cardiologist: None  Chart reviewed as part of pre-operative protocol coverage. Patient was contacted 09/20/2020 in reference to pre-operative risk assessment for pending surgery as outlined below.  SALY HOSKIN was last seen on 06/14/2020 by Dr. Tenny Craw.  Since that day, JOWANA OSTERKAMP has done well from a cardiac perspective, without angina or symptoms of decompensation.  I have reached out to Dr. Tenny Craw, who has indicated the patient may proceed with noncardiac surgery at an overall low risk.   Therefore, based on ACC/AHA guidelines, the patient would be at acceptable risk for the planned procedure without further cardiovascular testing.   The patient was advised that if she develops new symptoms prior to surgery to contact our office to arrange for a follow-up visit, and she verbalized understanding.  I will route this recommendation to the requesting party via Epic fax function and remove from pre-op pool. Please call with questions.  Eula Listen, PA-C 09/20/2020, 10:23 AM

## 2020-09-26 ENCOUNTER — Other Ambulatory Visit: Payer: Self-pay | Admitting: Internal Medicine

## 2020-09-26 ENCOUNTER — Other Ambulatory Visit: Payer: Self-pay

## 2020-09-26 ENCOUNTER — Ambulatory Visit: Payer: 59 | Admitting: Internal Medicine

## 2020-09-26 ENCOUNTER — Encounter: Payer: Self-pay | Admitting: Internal Medicine

## 2020-09-26 DIAGNOSIS — Z79899 Other long term (current) drug therapy: Secondary | ICD-10-CM

## 2020-09-26 DIAGNOSIS — D689 Coagulation defect, unspecified: Secondary | ICD-10-CM

## 2020-09-26 DIAGNOSIS — M87051 Idiopathic aseptic necrosis of right femur: Secondary | ICD-10-CM | POA: Insufficient documentation

## 2020-09-26 DIAGNOSIS — M87052 Idiopathic aseptic necrosis of left femur: Secondary | ICD-10-CM | POA: Insufficient documentation

## 2020-09-26 DIAGNOSIS — R7309 Other abnormal glucose: Secondary | ICD-10-CM

## 2020-09-26 DIAGNOSIS — I10 Essential (primary) hypertension: Secondary | ICD-10-CM

## 2020-09-26 NOTE — Progress Notes (Addendum)
    History of Present Illness:       Patient is a very nice 55 yo MWF with recent onset of new HTN in Feb this year and had EKG finding new LBBB ( new from 2016 EKG).  Patient had Cardiology evaluation by Dr Dorris Carnes with a normal Cardiac echo & Myoview w/o ischemia. Patient denies any suspect CP, palpitations, dyspnea or edema.       Patient also was recently dx'd with bilat aseptic necrosis of the hips and is fairly limited in gait and by pain with ambulation, sitting/ standing and is scheduled for upcoming Lt THA by Dr Mayer Camel on 10/18/2020.   Medications  .  chlorthalidone  25 MG tablet, Take 0.5 tablets (12.5 mg total) by mouth daily. Marland Kitchen  losartan 25 MG tablet, Take 1 tablet (25 mg total) by mouth daily.  .  fexofenadine  180 MG tablet, Take 1 tablet (180 mg total) by mouth daily.  .  traMADol (ULTRAM) 50 MG tablet, Take 50 mg by mouth every 8 (eight) hours as needed. .  ALPRAZolam (XANAX) 0.5 MG tablet, Take 1/2 to 1 tablet 2 to 3 x /day as needed for Anxiety or Sleep .  VITAMIN D , Take 15,000 Int'l Units by mouth.  .  DULoxetine 60 MG capsule, Take 1 capsule  Daily  for Pain .  pregabalin 100 MG capsule, Take  1 capsule  3 x /day  for Chronic Pain .  meloxicam (MOBIC) 15 MG tablet, Take  1 tablet  Daily  with Food for Pain & Inflammation  Problem list She has Osteopenia; Allergic rhinitis; Vitamin D deficiency; Gall bladder polyp; and Shingles Left T4 Dermatome 07/02/2020 on their problem list.   Observations/Objective:  BP 138/84   Pulse 74   Temp (!) 90 F (32.2 C)   Resp 12   Ht 5\' 5"  (1.651 m)   Wt 124 lb (56.2 kg)   SpO2 98%   BMI 20.63 kg/m   HEENT - WNL. Neck - supple.  Chest - Clear equal BS. Cor - Nl HS. RRR w/o sig MGR. PP 1(+). No edema. MS- FROM w/o deformities.  Waddling short steppage broad based gait with both feet everted laterally.  Neuro -  Nl w/o focal abnormalities.  Assessment and Plan:   1. Essential hypertension   2. 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 The patient was provided an opportunity to ask questions and all were answered. The patient agreed with the plan and demonstrated an understanding of the instructions.       The patient was advised to call back or seek an in-person evaluation if the symptoms worsen or if the condition fails to improve as anticipated.    Kirtland Bouchard, MD

## 2020-09-27 ENCOUNTER — Encounter: Payer: Self-pay | Admitting: Internal Medicine

## 2020-09-27 ENCOUNTER — Other Ambulatory Visit: Payer: Self-pay | Admitting: Internal Medicine

## 2020-09-27 DIAGNOSIS — D7589 Other specified diseases of blood and blood-forming organs: Secondary | ICD-10-CM

## 2020-09-27 DIAGNOSIS — R7989 Other specified abnormal findings of blood chemistry: Secondary | ICD-10-CM

## 2020-09-27 DIAGNOSIS — R945 Abnormal results of liver function studies: Secondary | ICD-10-CM

## 2020-09-27 LAB — COMPLETE METABOLIC PANEL WITH GFR
AG Ratio: 2.3 (calc) (ref 1.0–2.5)
ALT: 155 U/L — ABNORMAL HIGH (ref 6–29)
AST: 68 U/L — ABNORMAL HIGH (ref 10–35)
Albumin: 4.9 g/dL (ref 3.6–5.1)
Alkaline phosphatase (APISO): 167 U/L — ABNORMAL HIGH (ref 37–153)
BUN: 13 mg/dL (ref 7–25)
CO2: 31 mmol/L (ref 20–32)
Calcium: 11.1 mg/dL — ABNORMAL HIGH (ref 8.6–10.4)
Chloride: 101 mmol/L (ref 98–110)
Creat: 0.84 mg/dL (ref 0.50–1.05)
GFR, Est African American: 90 mL/min/{1.73_m2} (ref >=60)
GFR, Est Non African American: 78 mL/min/{1.73_m2} (ref >=60)
Globulin: 2.1 g/dL (calc) (ref 1.9–3.7)
Glucose, Bld: 92 mg/dL (ref 65–99)
Potassium: 4.4 mmol/L (ref 3.5–5.3)
Sodium: 141 mmol/L (ref 135–146)
Total Bilirubin: 0.4 mg/dL (ref 0.2–1.2)
Total Protein: 7 g/dL (ref 6.1–8.1)

## 2020-09-27 LAB — HEMOGLOBIN A1C
Hgb A1c MFr Bld: 4.8 % of total Hgb (ref <5.7)
Mean Plasma Glucose: 91 mg/dL
eAG (mmol/L): 5 mmol/L

## 2020-09-27 LAB — CBC WITH DIFFERENTIAL/PLATELET
Absolute Monocytes: 695 cells/uL (ref 200–950)
Basophils Absolute: 47 cells/uL (ref 0–200)
Basophils Relative: 0.6 %
Eosinophils Absolute: 126 cells/uL (ref 15–500)
Eosinophils Relative: 1.6 %
HCT: 39.9 % (ref 35.0–45.0)
Hemoglobin: 13.2 g/dL (ref 11.7–15.5)
Lymphs Abs: 2173 cells/uL (ref 850–3900)
MCH: 33.8 pg — ABNORMAL HIGH (ref 27.0–33.0)
MCHC: 33.1 g/dL (ref 32.0–36.0)
MCV: 102.3 fL — ABNORMAL HIGH (ref 80.0–100.0)
MPV: 10.9 fL (ref 7.5–12.5)
Monocytes Relative: 8.8 %
Neutro Abs: 4859 cells/uL (ref 1500–7800)
Neutrophils Relative %: 61.5 %
Platelets: 340 10*3/uL (ref 140–400)
RBC: 3.9 10*6/uL (ref 3.80–5.10)
RDW: 10.7 % — ABNORMAL LOW (ref 11.0–15.0)
Total Lymphocyte: 27.5 %
WBC: 7.9 10*3/uL (ref 3.8–10.8)

## 2020-09-27 LAB — PROTIME-INR
INR: 1
Prothrombin Time: 9.9 s (ref 9.0–11.5)

## 2020-09-27 MED ORDER — ACETAMINOPHEN 500 MG PO TABS: ORAL_TABLET | ORAL | 0 refills | Status: DC

## 2020-09-27 MED ORDER — DOCUSATE SODIUM 100 MG PO CAPS: ORAL_CAPSULE | ORAL | 1 refills | Status: DC

## 2020-09-27 MED ORDER — ASPIRIN 81 MG PO TBEC: 81.0000 mg | DELAYED_RELEASE_TABLET | Freq: Every day | ORAL | 0 refills | Status: DC

## 2020-09-27 MED ORDER — MAGNESIUM 500 MG PO TABS: ORAL_TABLET | ORAL | Status: DC

## 2020-09-27 MED ORDER — VITAMIN D3 125 MCG (5000 UT) PO CAPS: ORAL_CAPSULE | ORAL | 0 refills | Status: AC

## 2020-10-04 ENCOUNTER — Other Ambulatory Visit: Payer: 59

## 2020-10-04 ENCOUNTER — Other Ambulatory Visit: Payer: Self-pay

## 2020-10-04 DIAGNOSIS — D7589 Other specified diseases of blood and blood-forming organs: Secondary | ICD-10-CM

## 2020-10-04 DIAGNOSIS — R7989 Other specified abnormal findings of blood chemistry: Secondary | ICD-10-CM

## 2020-10-05 ENCOUNTER — Other Ambulatory Visit: Payer: Self-pay | Admitting: Internal Medicine

## 2020-10-05 DIAGNOSIS — R7989 Other specified abnormal findings of blood chemistry: Secondary | ICD-10-CM

## 2020-10-05 LAB — CBC WITH DIFFERENTIAL/PLATELET
Absolute Monocytes: 642 cells/uL (ref 200–950)
Basophils Absolute: 62 cells/uL (ref 0–200)
Basophils Relative: 0.9 %
Eosinophils Absolute: 131 cells/uL (ref 15–500)
Eosinophils Relative: 1.9 %
HCT: 40.8 % (ref 35.0–45.0)
Hemoglobin: 13.3 g/dL (ref 11.7–15.5)
Lymphs Abs: 1904 cells/uL (ref 850–3900)
MCH: 33.4 pg — ABNORMAL HIGH (ref 27.0–33.0)
MCHC: 32.6 g/dL (ref 32.0–36.0)
MCV: 102.5 fL — ABNORMAL HIGH (ref 80.0–100.0)
MPV: 10.6 fL (ref 7.5–12.5)
Monocytes Relative: 9.3 %
Neutro Abs: 4161 cells/uL (ref 1500–7800)
Neutrophils Relative %: 60.3 %
Platelets: 339 10*3/uL (ref 140–400)
RBC: 3.98 10*6/uL (ref 3.80–5.10)
RDW: 10.8 % — ABNORMAL LOW (ref 11.0–15.0)
Total Lymphocyte: 27.6 %
WBC: 6.9 10*3/uL (ref 3.8–10.8)

## 2020-10-05 LAB — COMPLETE METABOLIC PANEL WITH GFR
AG Ratio: 2.1 (calc) (ref 1.0–2.5)
ALT: 144 U/L — ABNORMAL HIGH (ref 6–29)
AST: 91 U/L — ABNORMAL HIGH (ref 10–35)
Albumin: 4.7 g/dL (ref 3.6–5.1)
Alkaline phosphatase (APISO): 159 U/L — ABNORMAL HIGH (ref 37–153)
BUN: 13 mg/dL (ref 7–25)
CO2: 30 mmol/L (ref 20–32)
Calcium: 10.5 mg/dL — ABNORMAL HIGH (ref 8.6–10.4)
Chloride: 103 mmol/L (ref 98–110)
Creat: 0.82 mg/dL (ref 0.50–1.05)
GFR, Est African American: 93 mL/min/{1.73_m2} (ref >=60)
GFR, Est Non African American: 80 mL/min/{1.73_m2} (ref >=60)
Globulin: 2.2 g/dL (calc) (ref 1.9–3.7)
Glucose, Bld: 65 mg/dL (ref 65–99)
Potassium: 4.6 mmol/L (ref 3.5–5.3)
Sodium: 143 mmol/L (ref 135–146)
Total Bilirubin: 0.2 mg/dL (ref 0.2–1.2)
Total Protein: 6.9 g/dL (ref 6.1–8.1)

## 2020-10-05 LAB — VITAMIN B12: Vitamin B-12: 484 pg/mL (ref 200–1100)

## 2020-10-05 NOTE — Progress Notes (Signed)
============================================================ ============================================================  -    Liver enzymes are still elevated  - will request Abd U/S to evaluate  ============================================================ ============================================================

## 2020-10-07 ENCOUNTER — Other Ambulatory Visit: Payer: Self-pay | Admitting: Internal Medicine

## 2020-10-07 DIAGNOSIS — R7989 Other specified abnormal findings of blood chemistry: Secondary | ICD-10-CM

## 2020-10-07 NOTE — Progress Notes (Signed)
Pt was called to discuss her lab results. Pt states she understood.

## 2020-10-09 ENCOUNTER — Other Ambulatory Visit: Payer: Self-pay

## 2020-10-09 ENCOUNTER — Other Ambulatory Visit: Payer: 59

## 2020-10-09 DIAGNOSIS — R7989 Other specified abnormal findings of blood chemistry: Secondary | ICD-10-CM

## 2020-10-11 ENCOUNTER — Ambulatory Visit (HOSPITAL_BASED_OUTPATIENT_CLINIC_OR_DEPARTMENT_OTHER)
Admission: RE | Admit: 2020-10-11 | Discharge: 2020-10-11 | Disposition: A | Discharge status: Home / Self Care | Payer: 59 | Source: Ambulatory Visit | Attending: Internal Medicine | Admitting: Internal Medicine

## 2020-10-11 ENCOUNTER — Other Ambulatory Visit: Payer: Self-pay

## 2020-10-11 DIAGNOSIS — R945 Abnormal results of liver function studies: Secondary | ICD-10-CM | POA: Diagnosis not present

## 2020-10-12 NOTE — Progress Notes (Signed)
============================================================ ============================================================  -    Abdominal Ultrasound finds no gallstones or sign of Kidney,                                                                    pancreas, liver or gallbladder cancer.   Known gallbladder polyp is increasing in size and radiologist recommended                   - Either surgical consultation to remove or                    - monitor annually for increasing size  - Liver does appear to have changes suggestive of Fatty Liver                                  - So Avoid Alcohol and only know treatment is weight loss ============================================================ ============================================================  -  Labs show Normal Protime  /INR - Normal Clotting   - Liver enzymes - Alkaline Phosphatase back to Normal and   ALT and AST are also improved, but still slightly elevated  So AVOID Alcohol and limit Tylenol to max 1,000 mg /day  ,,,,,,,,,,,,,,,,,,,,,,,,,,,,,,,,,,,,,,,,,,,,,,,,,,,,,,,,,,,,,,,,,,,,,,,,,,,,,,,,,,,,,,,,,,,,,,,,,,,,,,,,,,,,,,,,,,,,,,,,,,,,,,,  - Hepatitis  A, B and C - labs are Negative  ,,,,,,,,,,,,,,,,,,,,,,,,,,,,,,,,,,,,,,,,,,,,,,,,,,,,,,,,,,,,,,,,,,,,,,,,,,,,,,,,,,,,,,,,,,,,,,,,,,,,,,,,,,,,,,,,,,,,,,,,,,,,,,,  - Iron level is Normal   - Other tests to evaluate liver are negative with a couple tests still pending  ============================================================ ============================================================

## 2020-10-15 ENCOUNTER — Other Ambulatory Visit (INDEPENDENT_AMBULATORY_CARE_PROVIDER_SITE_OTHER): Payer: 59

## 2020-10-15 DIAGNOSIS — Z1152 Encounter for screening for COVID-19: Secondary | ICD-10-CM | POA: Diagnosis not present

## 2020-10-15 LAB — POC COVID19 BINAXNOW: SARS Coronavirus 2 Ag: NEGATIVE

## 2020-10-15 NOTE — Progress Notes (Signed)
Patient presents to the office for Covid testing. Patient was recently exposed to her husband who has tested positive and the patient is scheduled for Hip surgery on Friday. Test performed and results were negative. Patient will test again on Thursday prior to her procedure.

## 2020-10-22 LAB — COMPLETE METABOLIC PANEL WITH GFR
AG Ratio: 1.9 (calc) (ref 1.0–2.5)
ALT: 77 U/L — ABNORMAL HIGH (ref 6–29)
AST: 63 U/L — ABNORMAL HIGH (ref 10–35)
Albumin: 4.7 g/dL (ref 3.6–5.1)
Alkaline phosphatase (APISO): 148 U/L (ref 37–153)
BUN: 14 mg/dL (ref 7–25)
CO2: 31 mmol/L (ref 20–32)
Calcium: 10.4 mg/dL (ref 8.6–10.4)
Chloride: 100 mmol/L (ref 98–110)
Creat: 0.74 mg/dL (ref 0.50–1.05)
GFR, Est African American: 105 mL/min/{1.73_m2} (ref >=60)
GFR, Est Non African American: 91 mL/min/{1.73_m2} (ref >=60)
Globulin: 2.5 g/dL (calc) (ref 1.9–3.7)
Glucose, Bld: 82 mg/dL (ref 65–99)
Potassium: 4.2 mmol/L (ref 3.5–5.3)
Sodium: 142 mmol/L (ref 135–146)
Total Bilirubin: 0.3 mg/dL (ref 0.2–1.2)
Total Protein: 7.2 g/dL (ref 6.1–8.1)

## 2020-10-22 LAB — HEPATITIS C ANTIBODY
Hepatitis C Ab: NONREACTIVE
SIGNAL TO CUT-OFF: 0 (ref <1.00)

## 2020-10-22 LAB — COPPER, FREE: COPPER - FREE, SERUM/PLASMA: NOT DETECTED mcg/L

## 2020-10-22 LAB — IRON, TOTAL/TOTAL IRON BINDING CAP
%SAT: 18 % (calc) (ref 16–45)
Iron: 53 ug/dL (ref 45–160)
TIBC: 300 mcg/dL (calc) (ref 250–450)

## 2020-10-22 LAB — ANTI-SMOOTH MUSCLE ANTIBODY, IGG: Actin (Smooth Muscle) Antibody (IGG): <20 U (ref <20)

## 2020-10-22 LAB — FERRITIN: Ferritin: 204 ng/mL (ref 16–232)

## 2020-10-22 LAB — MITOCHONDRIAL ANTIBODIES: Mitochondrial M2 Ab, IgG: <=20 U

## 2020-10-22 LAB — HEPATITIS B E ANTIBODY: Hep B E Ab: NONREACTIVE

## 2020-10-22 LAB — HEPATITIS B CORE ANTIBODY, TOTAL: Hep B Core Total Ab: NONREACTIVE

## 2020-10-22 LAB — GAMMA GT: GGT: 165 U/L — ABNORMAL HIGH (ref 3–70)

## 2020-10-22 LAB — HEPATITIS A ANTIBODY, TOTAL: Hepatitis A AB,Total: NONREACTIVE

## 2020-10-22 LAB — HEPATITIS B SURFACE ANTIBODY,QUALITATIVE: Hep B S Ab: NONREACTIVE

## 2020-10-22 LAB — PROTIME-INR
INR: 0.9
Prothrombin Time: 9.6 s (ref 9.0–11.5)

## 2020-10-22 LAB — CERULOPLASMIN: Ceruloplasmin: 24 mg/dL (ref 18–53)

## 2020-11-29 ENCOUNTER — Telehealth: Payer: Self-pay | Admitting: Internal Medicine

## 2020-11-29 ENCOUNTER — Other Ambulatory Visit: Payer: Self-pay | Admitting: Internal Medicine

## 2020-11-29 DIAGNOSIS — H6122 Impacted cerumen, left ear: Secondary | ICD-10-CM

## 2020-11-29 DIAGNOSIS — H6022 Malignant otitis externa, left ear: Secondary | ICD-10-CM

## 2020-11-29 MED ORDER — HYDROCORTISONE-ACETIC ACID 1-2 % OT SOLN: OTIC | 1 refills | Status: DC

## 2020-11-29 MED ORDER — DOXYCYCLINE HYCLATE 100 MG PO CAPS: ORAL_CAPSULE | ORAL | 0 refills | Status: DC

## 2020-11-29 MED ORDER — TOBRAMYCIN-DEXAMETHASONE 0.3-0.1 % OP SUSP: OPHTHALMIC | 1 refills | Status: DC

## 2020-11-29 NOTE — Telephone Encounter (Signed)
[facility] Pre-operative Risk Assessment    Patient Name: TARAYA LIEBEL  DOB: [birth year] MRN: [id number]  HEARTCARE STAFF:  - IMPORTANT!!!!!! Under Visit Info/Reason for Call, type in Other and utilize the format Clearance MM/DD/YY or Clearance TBD. Do not use dashes or single digits. - Please review there is not already an duplicate clearance open for this procedure. - If request is for dental extraction, please clarify the # of teeth to be extracted. - If the patient is currently at the dentist's office, call Pre-Op Callback Staff (MA/nurse) to input urgent request.  - If the patient is not currently in the dentist office, please route to the Pre-Op pool.  Request for surgical clearance:  What type of surgery is being performed? Right hip replacement   When is this surgery scheduled? 01/03/21  What type of clearance is required (medical clearance vs. Pharmacy clearance to hold med vs. Both)? Both  Are there any medications that need to be held prior to surgery and how long? Not they are aware of   Practice name and name of physician performing surgery? [facility], Dr. Gean Birchwood  What is the office phone number?   [phone] 7.   What is the office fax number? [phone]  8.   Anesthesia type (None, local, MAC, general) ? Spinal    Brooks Sailors 11/29/2020, 3:08 PM  _________________________________________________________________   (provider comments below)

## 2020-11-29 NOTE — Telephone Encounter (Signed)
Duplicate request. Resent original clearance 11/29/20

## 2020-12-09 ENCOUNTER — Other Ambulatory Visit: Payer: Self-pay | Admitting: Internal Medicine

## 2020-12-09 DIAGNOSIS — I10 Essential (primary) hypertension: Secondary | ICD-10-CM

## 2020-12-09 DIAGNOSIS — D649 Anemia, unspecified: Secondary | ICD-10-CM

## 2020-12-09 DIAGNOSIS — D689 Coagulation defect, unspecified: Secondary | ICD-10-CM

## 2020-12-09 DIAGNOSIS — Z79899 Other long term (current) drug therapy: Secondary | ICD-10-CM

## 2020-12-16 ENCOUNTER — Other Ambulatory Visit: Payer: 59

## 2020-12-16 ENCOUNTER — Other Ambulatory Visit: Payer: Self-pay

## 2020-12-16 DIAGNOSIS — I10 Essential (primary) hypertension: Secondary | ICD-10-CM

## 2020-12-16 DIAGNOSIS — D689 Coagulation defect, unspecified: Secondary | ICD-10-CM

## 2020-12-16 DIAGNOSIS — Z79899 Other long term (current) drug therapy: Secondary | ICD-10-CM

## 2020-12-16 DIAGNOSIS — D649 Anemia, unspecified: Secondary | ICD-10-CM

## 2020-12-17 LAB — COMPLETE METABOLIC PANEL WITH GFR
AG Ratio: 2 (calc) (ref 1.0–2.5)
ALT: 24 U/L (ref 6–29)
AST: 25 U/L (ref 10–35)
Albumin: 4.9 g/dL (ref 3.6–5.1)
Alkaline phosphatase (APISO): 123 U/L (ref 37–153)
BUN: 15 mg/dL (ref 7–25)
CO2: 32 mmol/L (ref 20–32)
Calcium: 10.1 mg/dL (ref 8.6–10.4)
Chloride: 96 mmol/L — ABNORMAL LOW (ref 98–110)
Creat: 0.75 mg/dL (ref 0.50–1.03)
Globulin: 2.5 g/dL (calc) (ref 1.9–3.7)
Glucose, Bld: 77 mg/dL (ref 65–99)
Potassium: 4.3 mmol/L (ref 3.5–5.3)
Sodium: 139 mmol/L (ref 135–146)
Total Bilirubin: 0.4 mg/dL (ref 0.2–1.2)
Total Protein: 7.4 g/dL (ref 6.1–8.1)
eGFR: 93 mL/min/{1.73_m2} (ref >=60)

## 2020-12-17 LAB — CBC WITH DIFFERENTIAL/PLATELET
Absolute Monocytes: 798 cells/uL (ref 200–950)
Basophils Absolute: 67 cells/uL (ref 0–200)
Basophils Relative: 0.7 %
Eosinophils Absolute: 181 cells/uL (ref 15–500)
Eosinophils Relative: 1.9 %
HCT: 44.5 % (ref 35.0–45.0)
Hemoglobin: 14.1 g/dL (ref 11.7–15.5)
Lymphs Abs: 1720 cells/uL (ref 850–3900)
MCH: 31.5 pg (ref 27.0–33.0)
MCHC: 31.7 g/dL — ABNORMAL LOW (ref 32.0–36.0)
MCV: 99.3 fL (ref 80.0–100.0)
MPV: 10.7 fL (ref 7.5–12.5)
Monocytes Relative: 8.4 %
Neutro Abs: 6736 cells/uL (ref 1500–7800)
Neutrophils Relative %: 70.9 %
Platelets: 359 10*3/uL (ref 140–400)
RBC: 4.48 10*6/uL (ref 3.80–5.10)
RDW: 11.8 % (ref 11.0–15.0)
Total Lymphocyte: 18.1 %
WBC: 9.5 10*3/uL (ref 3.8–10.8)

## 2020-12-17 LAB — PROTIME-INR
INR: 1
Prothrombin Time: 9.9 s (ref 9.0–11.5)

## 2020-12-17 NOTE — Progress Notes (Signed)
~~~~~~~~~~~~~~~~~~~~~~~~~~~~~~~~~~~~~~~~~~~~~~~~~~~~~~~~~~~~~~~ ~~~~~~~~~~~~~~~~~~~~~~~~~~~~~~~~~~~~~~~~~~~~~~~~~~~~~~~~~~~~~~~  -   CBC - NORMAL ~~~~~~~~~~~~~~~~~~~~~~~~~~~~~~~~~~~~~~~~~~~~~~~~~~~~~~~~~~~~~~~ ~~~~~~~~~~~~~~~~~~~~~~~~~~~~~~~~~~~~~~~~~~~~~~~~~~~~~~~~~~~~~~~  - ProTime / INR - Clotting is NORMAL ~~~~~~~~~~~~~~~~~~~~~~~~~~~~~~~~~~~~~~~~~~~~~~~~~~~~~~~~~~~~~~~ ~~~~~~~~~~~~~~~~~~~~~~~~~~~~~~~~~~~~~~~~~~~~~~~~~~~~~~~~~~~~~~~  - All Else - Bl Sugar  (77 mg%) - Kidneys - Electrolytes - Liver -  all  Normal / OK ~~~~~~~~~~~~~~~~~~~~~~~~~~~~~~~~~~~~~~~~~~~~~~~~~~~~~~~~~~~~~~~ ~~~~~~~~~~~~~~~~~~~~~~~~~~~~~~~~~~~~~~~~~~~~~~~~~~~~~~~~~~~~~~~

## 2020-12-28 ENCOUNTER — Other Ambulatory Visit: Payer: Self-pay | Admitting: Internal Medicine

## 2020-12-28 DIAGNOSIS — M76899 Other specified enthesopathies of unspecified lower limb, excluding foot: Secondary | ICD-10-CM

## 2021-02-17 ENCOUNTER — Other Ambulatory Visit: Payer: Self-pay | Admitting: Internal Medicine

## 2021-02-17 MED ORDER — LOSARTAN POTASSIUM 25 MG PO TABS: ORAL_TABLET | ORAL | 3 refills | Status: DC

## 2021-02-18 ENCOUNTER — Other Ambulatory Visit: Payer: Self-pay | Admitting: Internal Medicine

## 2021-02-18 MED ORDER — TIRZEPATIDE 2.5 MG/0.5ML ~~LOC~~ SOAJ: 2.5000 mg | SUBCUTANEOUS | 0 refills | Status: DC

## 2021-02-20 ENCOUNTER — Other Ambulatory Visit: Payer: Self-pay

## 2021-02-20 ENCOUNTER — Ambulatory Visit (INDEPENDENT_AMBULATORY_CARE_PROVIDER_SITE_OTHER): Payer: 59

## 2021-02-20 VITALS — Temp 97.2°F

## 2021-02-20 DIAGNOSIS — Z23 Encounter for immunization: Secondary | ICD-10-CM

## 2021-03-05 ENCOUNTER — Other Ambulatory Visit: Payer: Self-pay | Admitting: Internal Medicine

## 2021-03-05 MED ORDER — TIRZEPATIDE 5 MG/0.5ML ~~LOC~~ SOAJ: 5.0000 mg | SUBCUTANEOUS | 0 refills | Status: DC

## 2021-03-25 ENCOUNTER — Other Ambulatory Visit: Payer: Self-pay | Admitting: Internal Medicine

## 2021-03-25 MED ORDER — CEPHALEXIN 500 MG PO CAPS: ORAL_CAPSULE | ORAL | 0 refills | Status: DC

## 2021-04-01 ENCOUNTER — Other Ambulatory Visit: Payer: Self-pay | Admitting: Internal Medicine

## 2021-04-02 ENCOUNTER — Other Ambulatory Visit: Payer: Self-pay | Admitting: Internal Medicine

## 2021-04-29 ENCOUNTER — Other Ambulatory Visit: Payer: Self-pay | Admitting: Internal Medicine

## 2021-04-29 ENCOUNTER — Other Ambulatory Visit: Payer: Self-pay

## 2021-04-29 MED ORDER — MOUNJARO 5 MG/0.5ML ~~LOC~~ SOAJ: SUBCUTANEOUS | 2 refills | Status: DC

## 2021-06-04 NOTE — Progress Notes (Signed)
Complete Physical  Assessment and Plan:  Elveta was seen today for annual exam.  Diagnoses and all orders for this visit: Encounter for routine adult medical exam with abnormal findings Yearly Due for Mammogram Colonoscopy Pap  Aseptic Necrosis of Both Hips/Osteoarthritis of both hips, unspecified osteoarthritis type/Bursitis of both hips, unspecified bursa Both hips replaced in the past year Continue to follow with ortho Will begin to wean off Cymbalta 40mg  qd x 7, 20 mg qd x 7 then 20 mg QOD x 7 days then stop  Hypertension, essential Continue current medications: Losartan 40mg  at night and bisoprolol 10mg  in the morning Monitor blood pressure at home; call if consistently over 130/80 Continue DASH diet.   Reminder to go to the ER if any CP, SOB, nausea, dizziness, severe HA, changes vision/speech, left arm numbness and tingling and jaw pain. - CBC - CMP  Abnormal glucose Continue diet and exercise A1c  Medication management Magnesium  Screening, ischemic heart disease EKG  Vitamin D deficiency Continue supplementation to maintain goal of 70-100 Taking Vitamin D  IU 15,000daily  vitamin D level  Gallbladder polyp 09/2020 last abdominal ultrasound- recommend surgical consult, pt declines  Will repeat U/S in 6 months Continue to monitor  Screening for thyroid disorder -     TSH  Screening cholesterol level -     Lipid panel  Screening for diabetes mellitus -     Hemoglobin A1c   Encounter for vitamin deficiency screening -     Vitamin B12  Screening for blood or protein in urine -     Urinalysis w reflex microscopic - Microalbumin/creatinine urine ratio  Perimenopausal vasomotor symptoms -     Follicle stimulating hormone - LH  Colon Cancer Screening - Cologuard ordered  Discussed med's effects and SE's. Screening labs and tests as requested with regular follow-up as recommended. Over 40 minutes of interview, exam, counseling, chart review, and  complex, high level critical decision making was performed this visit.   HPI  56 y.o. female  presents for a complete physical and follow up for has Osteopenia; Allergic rhinitis; Vitamin D deficiency; Gall bladder polyp; Shingles Left T4 Dermatome 07/02/2020; Essential hypertension; and Aseptic necrosis of bone of both hips ([place]) on their problem list..  She had left hip replacement 09/2020 and right hip replacement 12/2020. Right does still have some mild aching. Good ROM in both.   She is due for U/S recheck of GB polyp 2016 referral to general surgery , that was stable in , 2014 originally diagnosed in 2012. She has had NEG GGT labs and denies abdominal symptoms. Last U/S showed increase in size to 9 mm does not want surgical consult, will repeat U/S in 09/2021  She is currently on Mounjaro 5 mg weekly and has no side effects BMI is Body mass index is 19.47 kg/m., she has been working on diet and exercise. Wt Readings from Last 3 Encounters:  06/05/21 117 lb (53.1 kg)  09/26/20 124 lb (56.2 kg)  09/13/20 143 lb (64.9 kg)   DERM: Family history of melanoma   Her blood pressure has been controlled at home, today their BP is BP: 110/62 She does not workout. She denies chest pain, shortness of breath, dizziness.  She is check her blood pressure twice a day prior to medications.  She is ranging from 140's-150's over 60's-70's.  Sh is taking Losartan 25 mg  every evening and bisoprolol in the mornings. Denies any side effects from the medications. EKG: LBBB noted, last  EKG 2016 Had previous cardiology consult, with stress test and echo normal   She is not on cholesterol medication and denies myalgias. Her cholesterol is at goal. The cholesterol last visit was:   Lab Results  Component Value Date   CHOL 261 (H) 06/05/2020   HDL 94 06/05/2020   LDLCALC 144 (H) 06/05/2020   TRIG 109 06/05/2020   CHOLHDL 2.8 06/05/2020    She has not been working on diet and exercise for prediabetes, she  is on bASA, she is on ACE/ARB and denies polydipsia, polyuria, visual disturbances, vomiting and weight loss. Last A1C in the office was:  Lab Results  Component Value Date   HGBA1C 4.8 09/26/2020    Last GFR: Lab Results  Component Value Date   GFRNONAA 91 10/09/2020   Lab Results  Component Value Date   GFRAA 105 10/09/2020    Patient is on Vitamin D supplement.   Lab Results  Component Value Date   VD25OH 57 06/05/2020      Current Medications:  Current Outpatient Medications on File Prior to Visit  Medication Sig Dispense Refill   aspirin 81 MG EC tablet Take 1 tablet (81 mg total) by mouth daily. Take  1 tablet Daily 365 tablet 0   Cholecalciferol (VITAMIN D3) 125 MCG (5000 UT) CAPS Take 2 capsules  (10,000 units)  Daily 30 capsule 0   docusate sodium (COLACE) 100 MG capsule Takes 2 capsules every morning 60 capsule 1   DULoxetine (CYMBALTA) 60 MG capsule Take 1 capsule  Daily  for Pain 30 capsule 11   fexofenadine (ALLEGRA) 180 MG tablet Take 1 tablet (180 mg total) by mouth daily. 90 tablet 3   losartan (COZAAR) 25 MG tablet Take  1 tablet  Daily  for BP 90 tablet 3   Magnesium 500 MG TABS Take 1 tablet Daily 30 tablet    pregabalin (LYRICA) 100 MG capsule TAKE 1 CAPSULE BY MOUTH THREE TIMES DAILY FOR CHRONIC PAIN 270 capsule 1   tirzepatide (MOUNJARO) 5 MG/0.5ML Pen Inject 5 mg  ( 0.5 ml ) into the skin  once  / week    (  Dx - E11.9  ) 2 mL 2   acetaminophen (TYLENOL) 500 MG tablet Takes  4 tablets  every morning (Patient not taking: Reported on 06/05/2021) 1 tablet 0   No current facility-administered medications on file prior to visit.   Allergies:  Allergies  Allergen Reactions   Influenza Vaccines    Medical History:  She has Osteopenia; Allergic rhinitis; Vitamin D deficiency; Gall bladder polyp; Shingles Left T4 Dermatome 07/02/2020; Essential hypertension; and Aseptic necrosis of bone of both hips ([place]) on their problem list. Health Maintenance:    Immunization History  Administered Date(s) Administered   Influenza Inj Mdck Quad With Preservative 03/01/2017, 02/25/2018, 02/07/2019, 02/23/2020   Influenza,inj,Quad PF,6+ Mos 02/20/2021   PFIZER(Purple Top)SARS-COV-2 Vaccination 11/24/2019, 12/15/2019, 12/15/2019   PPD Test 06/08/2014   Tdap 05/29/2008, 12/08/2018    Vaccinations: TD or Tdap: 11/2018  Influenza: 10/21 Pneumococcal: N/A Shingles: Zostavax/Shingrix: Discussed with patient   Preventative Care: Last colonoscopy: DUE Last mammogram: 08/2020 Last pap smear/pelvic exam: DUE   DEXA:N/A Hep C screening ([id number]): Completed today    Names of Other Physician/Practitioners you currently use: 1. [facility] here for primary care 2. Eye Exam: 2022 3. Dental Exam 2021, schedule for 2022   Patient Care Team: Unk Pinto, MD as PCP - General (Internal Medicine) Maisie Fus, MD  as Consulting Physician (Obstetrics and Gynecology)  Surgical History:  She has a past surgical history that includes Cesarean section; Novasure ablation (2007); Wisdom tooth extraction; and Skin surgery (Left). Family History:  Herfamily history includes Aneurysm in her maternal grandmother; Breast cancer in her mother; Cancer in her maternal aunt and paternal uncle; Cancer (age of onset: 48) in her father; Colon polyps in her sister; Heart disease in her maternal grandfather; Hyperlipidemia in her maternal grandfather and mother; Hypertension in her mother; Osteopenia in her mother; Stroke in her maternal grandmother. Social History:  She reports that she quit smoking about 28 years ago. She has never used smokeless tobacco. She reports current alcohol use. She reports that she does not use drugs.  Review of Systems: Review of Systems  Constitutional:  Negative for chills and fever.  HENT:  Negative for congestion, hearing loss, sinus pain, sore throat and tinnitus.   Eyes:  Negative for blurred  vision and double vision.  Respiratory:  Negative for cough, hemoptysis, sputum production, shortness of breath and wheezing.   Cardiovascular:  Negative for chest pain, palpitations and leg swelling.  Gastrointestinal:  Negative for abdominal pain, constipation, diarrhea, heartburn, nausea and vomiting.  Genitourinary:  Negative for dysuria and urgency.  Musculoskeletal:  Negative for back pain, falls, joint pain, myalgias and neck pain.  Skin:  Negative for rash.  Neurological:  Negative for dizziness, tingling, tremors, weakness and headaches.  Endo/Heme/Allergies:  Does not bruise/bleed easily.  Psychiatric/Behavioral:  Negative for depression and suicidal ideas. The patient is not nervous/anxious and does not have insomnia.    Physical Exam: Estimated body mass index is 19.47 kg/m as calculated from the following:   Height as of this encounter: 5\' 5"  (1.651 m).   Weight as of this encounter: 117 lb (53.1 kg). BP 110/62    Pulse 77    Temp (!) 97.3 F (36.3 C)    Ht 5\' 5"  (1.651 m)    Wt 117 lb (53.1 kg)    SpO2 98%    BMI 19.47 kg/m  General Appearance: Well nourished, in no apparent distress.  Eyes: PERRLA, EOMs, conjunctiva no swelling or erythema, normal fundi and vessels.  Sinuses: No Frontal/maxillary tenderness  ENT/Mouth: Ext aud canals clear, normal light reflex with TMs without erythema, bulging. Good dentition. No erythema, swelling, or exudate on post pharynx. Tonsils not swollen or erythematous. Hearing normal.  Neck: Supple, thyroid normal. No bruits  Respiratory: Respiratory effort normal, BS equal bilaterally without rales, rhonchi, wheezing or stridor.  Cardio: RRR without murmurs, rubs or gallops. Brisk peripheral pulses without edema.  Chest: symmetric, with normal excursions and percussion.  Breasts: Symmetric, without lumps, nipple discharge, retractions.  Abdomen: Soft, nontender, no guarding, rebound, hernias, masses, or organomegaly.  Lymphatics: Non tender  without lymphadenopathy.  Genitourinary:  Musculoskeletal: Full ROM all peripheral extremities,5/5 strength, and normal gait. Right upper leg/hip slightly decreased strength Skin: Warm, dry without rashes, lesions, ecchymosis. Neuro: Cranial nerves intact, reflexes equal bilaterally. Normal muscle tone, no cerebellar symptoms. Sensation intact.  Psych: Awake and oriented X 3, normal affect, Insight and Judgment appropriate.    EKG: NSR no ST changes.   Magda Bernheim ANP-C  [facility]  06/05/2021

## 2021-06-05 ENCOUNTER — Other Ambulatory Visit: Payer: Self-pay

## 2021-06-05 ENCOUNTER — Encounter: Payer: Self-pay | Admitting: Nurse Practitioner

## 2021-06-05 ENCOUNTER — Ambulatory Visit (INDEPENDENT_AMBULATORY_CARE_PROVIDER_SITE_OTHER): Payer: 59 | Admitting: Nurse Practitioner

## 2021-06-05 VITALS — BP 110/62 | HR 77 | Temp 97.3°F | Ht 65.0 in | Wt 117.0 lb

## 2021-06-05 DIAGNOSIS — Z136 Encounter for screening for cardiovascular disorders: Secondary | ICD-10-CM | POA: Diagnosis not present

## 2021-06-05 DIAGNOSIS — Z0000 Encounter for general adult medical examination without abnormal findings: Secondary | ICD-10-CM | POA: Diagnosis not present

## 2021-06-05 DIAGNOSIS — I10 Essential (primary) hypertension: Secondary | ICD-10-CM | POA: Diagnosis not present

## 2021-06-05 DIAGNOSIS — Z0001 Encounter for general adult medical examination with abnormal findings: Secondary | ICD-10-CM

## 2021-06-05 DIAGNOSIS — Z79899 Other long term (current) drug therapy: Secondary | ICD-10-CM

## 2021-06-05 DIAGNOSIS — R209 Unspecified disturbances of skin sensation: Secondary | ICD-10-CM

## 2021-06-05 DIAGNOSIS — N951 Menopausal and female climacteric states: Secondary | ICD-10-CM

## 2021-06-05 DIAGNOSIS — R7309 Other abnormal glucose: Secondary | ICD-10-CM

## 2021-06-05 DIAGNOSIS — Z13220 Encounter for screening for lipoid disorders: Secondary | ICD-10-CM

## 2021-06-05 DIAGNOSIS — M87051 Idiopathic aseptic necrosis of right femur: Secondary | ICD-10-CM

## 2021-06-05 DIAGNOSIS — Z1329 Encounter for screening for other suspected endocrine disorder: Secondary | ICD-10-CM

## 2021-06-05 DIAGNOSIS — Z1389 Encounter for screening for other disorder: Secondary | ICD-10-CM

## 2021-06-05 DIAGNOSIS — E559 Vitamin D deficiency, unspecified: Secondary | ICD-10-CM

## 2021-06-05 DIAGNOSIS — D649 Anemia, unspecified: Secondary | ICD-10-CM

## 2021-06-05 DIAGNOSIS — Z1211 Encounter for screening for malignant neoplasm of colon: Secondary | ICD-10-CM

## 2021-06-05 DIAGNOSIS — Z1321 Encounter for screening for nutritional disorder: Secondary | ICD-10-CM

## 2021-06-05 DIAGNOSIS — K824 Cholesterolosis of gallbladder: Secondary | ICD-10-CM

## 2021-06-05 MED ORDER — DULOXETINE HCL 20 MG PO CPEP: ORAL_CAPSULE | ORAL | 0 refills | Status: DC

## 2021-06-06 LAB — LIPID PANEL
Cholesterol: 215 mg/dL — ABNORMAL HIGH (ref <200)
HDL: 71 mg/dL (ref >=50)
LDL Cholesterol (Calc): 127 mg/dL (calc) — ABNORMAL HIGH
Non-HDL Cholesterol (Calc): 144 mg/dL (calc) — ABNORMAL HIGH (ref <130)
Total CHOL/HDL Ratio: 3 (calc) (ref <5.0)
Triglycerides: 76 mg/dL (ref <150)

## 2021-06-06 LAB — CBC WITH DIFFERENTIAL/PLATELET
Absolute Monocytes: 582 cells/uL (ref 200–950)
Basophils Absolute: 64 cells/uL (ref 0–200)
Basophils Relative: 0.9 %
Eosinophils Absolute: 213 cells/uL (ref 15–500)
Eosinophils Relative: 3 %
HCT: 39.6 % (ref 35.0–45.0)
Hemoglobin: 13.3 g/dL (ref 11.7–15.5)
Lymphs Abs: 1967 cells/uL (ref 850–3900)
MCH: 31.4 pg (ref 27.0–33.0)
MCHC: 33.6 g/dL (ref 32.0–36.0)
MCV: 93.6 fL (ref 80.0–100.0)
MPV: 10.5 fL (ref 7.5–12.5)
Monocytes Relative: 8.2 %
Neutro Abs: 4274 cells/uL (ref 1500–7800)
Neutrophils Relative %: 60.2 %
Platelets: 309 10*3/uL (ref 140–400)
RBC: 4.23 10*6/uL (ref 3.80–5.10)
RDW: 13 % (ref 11.0–15.0)
Total Lymphocyte: 27.7 %
WBC: 7.1 10*3/uL (ref 3.8–10.8)

## 2021-06-06 LAB — COMPLETE METABOLIC PANEL WITH GFR
AG Ratio: 2.3 (calc) (ref 1.0–2.5)
ALT: 32 U/L — ABNORMAL HIGH (ref 6–29)
AST: 24 U/L (ref 10–35)
Albumin: 4.6 g/dL (ref 3.6–5.1)
Alkaline phosphatase (APISO): 70 U/L (ref 37–153)
BUN: 9 mg/dL (ref 7–25)
CO2: 28 mmol/L (ref 20–32)
Calcium: 10 mg/dL (ref 8.6–10.4)
Chloride: 102 mmol/L (ref 98–110)
Creat: 0.84 mg/dL (ref 0.50–1.03)
Globulin: 2 g/dL (calc) (ref 1.9–3.7)
Glucose, Bld: 72 mg/dL (ref 65–99)
Potassium: 4.5 mmol/L (ref 3.5–5.3)
Sodium: 140 mmol/L (ref 135–146)
Total Bilirubin: 0.7 mg/dL (ref 0.2–1.2)
Total Protein: 6.6 g/dL (ref 6.1–8.1)
eGFR: 82 mL/min/{1.73_m2} (ref >=60)

## 2021-06-06 LAB — MICROALBUMIN / CREATININE URINE RATIO
Creatinine, Urine: 119 mg/dL (ref 20–275)
Microalb Creat Ratio: 4 mcg/mg creat (ref <30)
Microalb, Ur: 0.5 mg/dL

## 2021-06-06 LAB — VITAMIN D 25 HYDROXY (VIT D DEFICIENCY, FRACTURES): Vit D, 25-Hydroxy: 118 ng/mL — ABNORMAL HIGH (ref 30–100)

## 2021-06-06 LAB — URINALYSIS, ROUTINE W REFLEX MICROSCOPIC
Bacteria, UA: NONE SEEN /HPF
Bilirubin Urine: NEGATIVE
Glucose, UA: NEGATIVE
Hgb urine dipstick: NEGATIVE
Hyaline Cast: NONE SEEN /LPF
Ketones, ur: NEGATIVE
Nitrite: NEGATIVE
Protein, ur: NEGATIVE
RBC / HPF: NONE SEEN /HPF (ref 0–2)
Specific Gravity, Urine: 1.013 (ref 1.001–1.035)
WBC, UA: NONE SEEN /HPF (ref 0–5)
pH: 7 (ref 5.0–8.0)

## 2021-06-06 LAB — VITAMIN B12: Vitamin B-12: 350 pg/mL (ref 200–1100)

## 2021-06-06 LAB — HEMOGLOBIN A1C
Hgb A1c MFr Bld: 5 % of total Hgb (ref <5.7)
Mean Plasma Glucose: 97 mg/dL
eAG (mmol/L): 5.4 mmol/L

## 2021-06-06 LAB — FOLLICLE STIMULATING HORMONE: FSH: 117.1 m[IU]/mL — ABNORMAL HIGH

## 2021-06-06 LAB — TSH: TSH: 1.15 mIU/L (ref 0.40–4.50)

## 2021-06-06 LAB — MICROSCOPIC MESSAGE

## 2021-06-06 LAB — MAGNESIUM: Magnesium: 2.3 mg/dL (ref 1.5–2.5)

## 2021-06-06 LAB — LUTEINIZING HORMONE: LH: 73.3 m[IU]/mL

## 2021-06-23 IMAGING — MG MM DIGITAL SCREENING BILAT W/ TOMO W/ CAD
8 series · 9 of 24 positions shown · non-contrast
Comparison: Previous exam(s).

CLINICAL DATA: Screening.

EXAM:
DIGITAL SCREENING BILATERAL MAMMOGRAM WITH TOMO AND CAD

[R CC synth-2D]
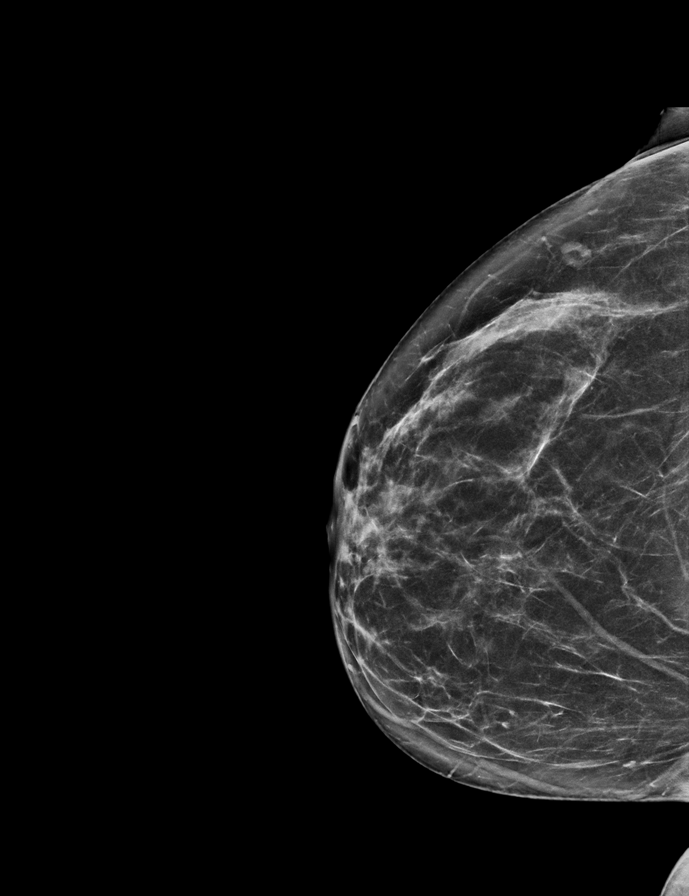

[L CC synth-2D]
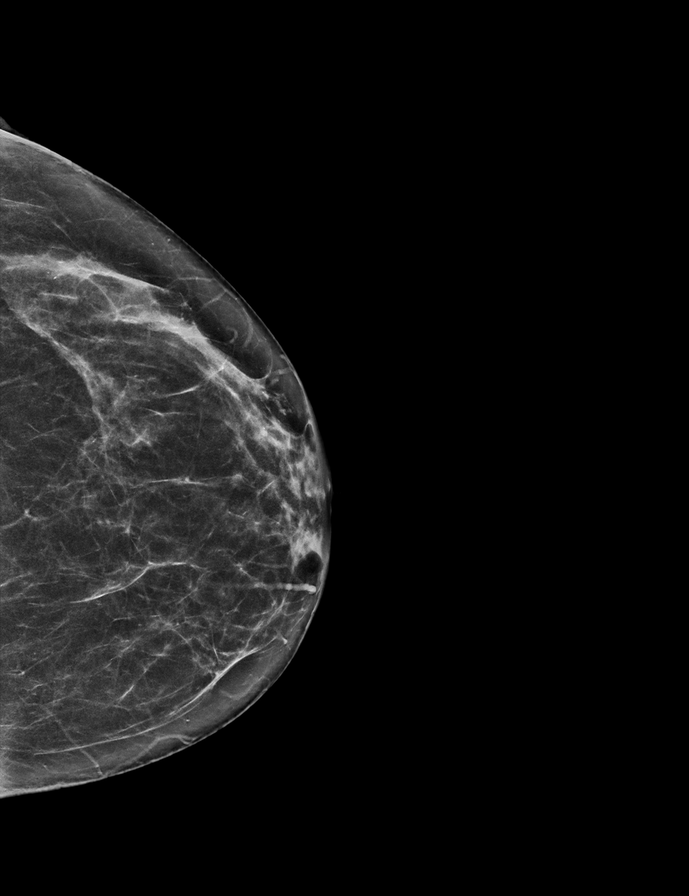

[L MLO synth-2D]
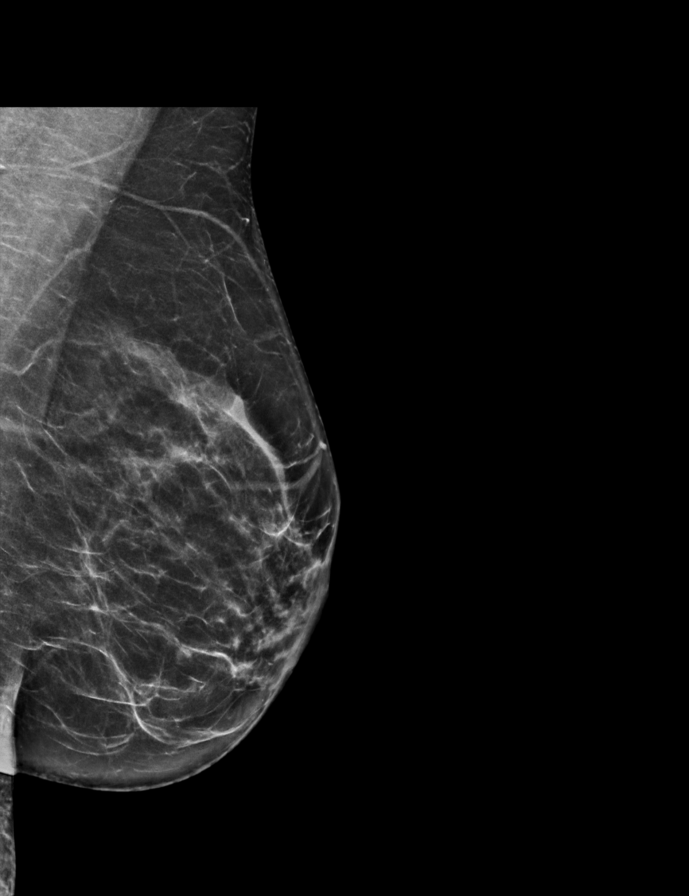

[R MLO synth-2D]
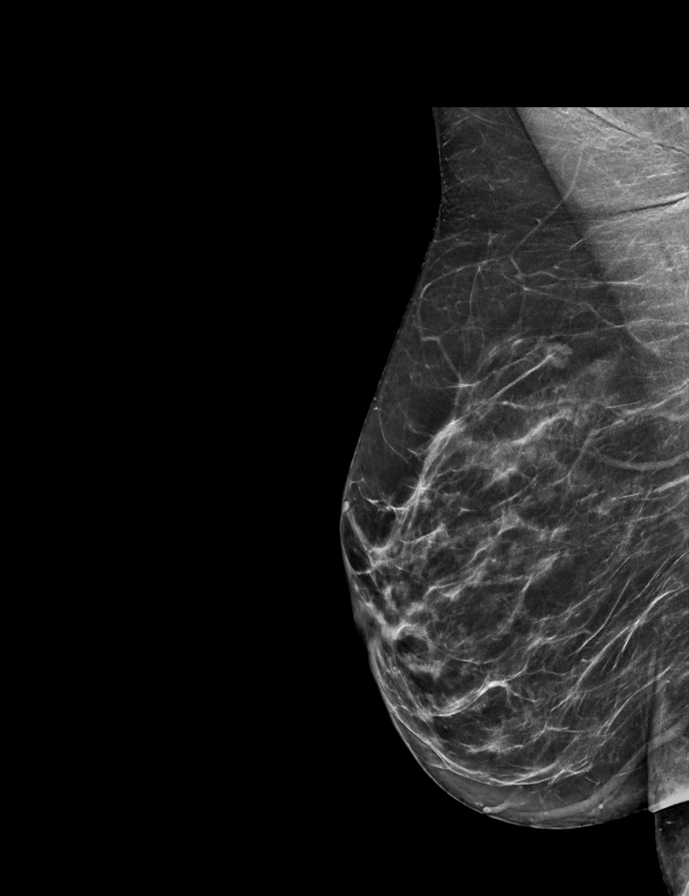

[L CC tomo · 2 of 59 frames shown]
[frame 20/59]
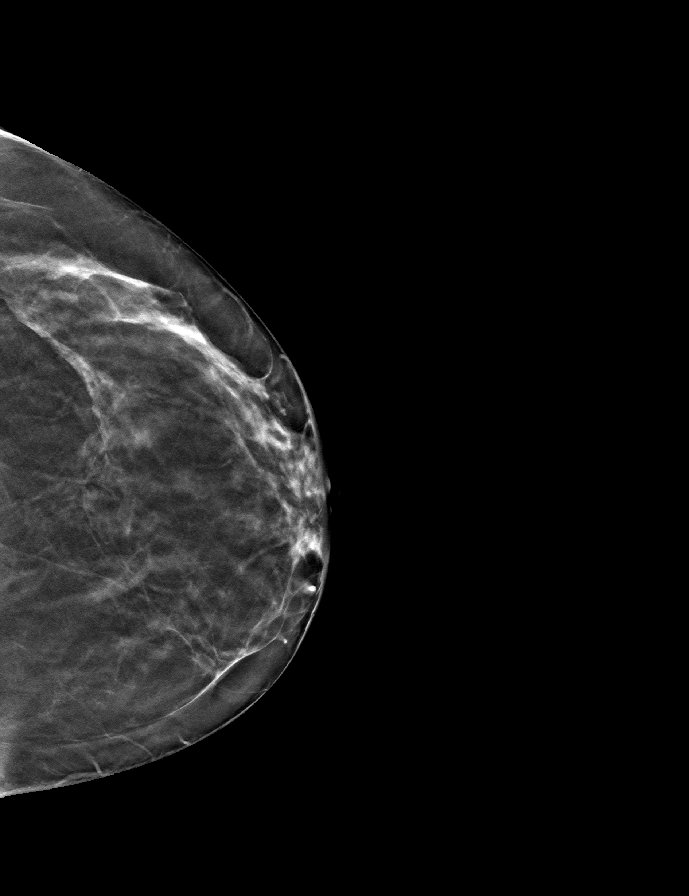
[frame 30/59]
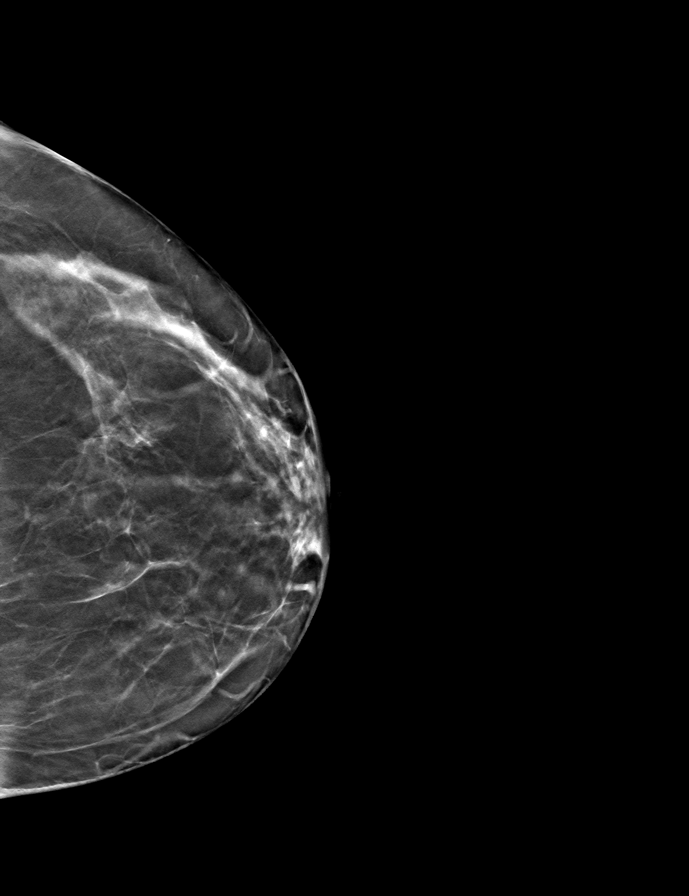

[R MLO tomo · tomo slice 31/62.0]
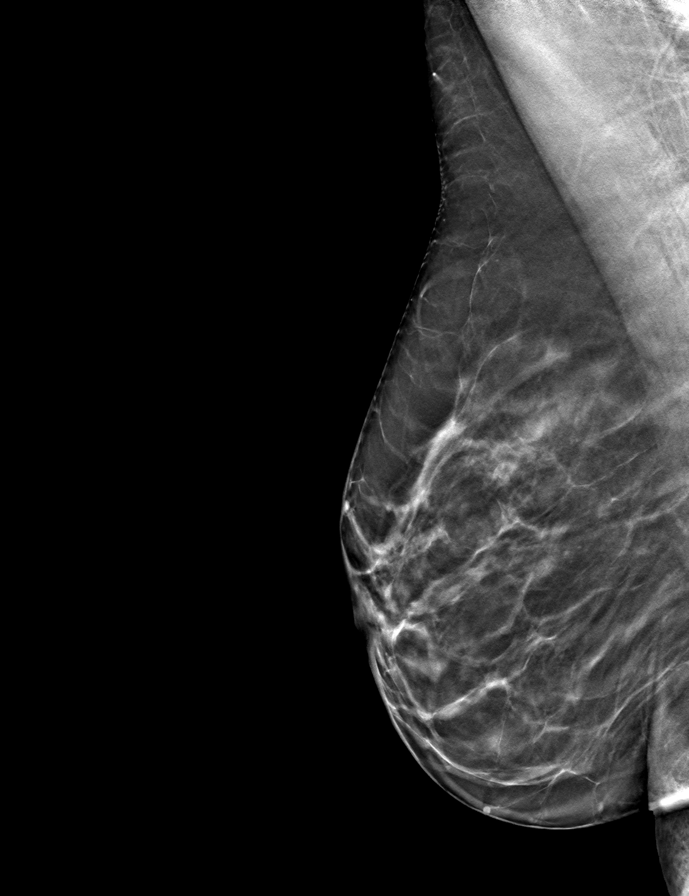

[L MLO tomo · tomo slice 33/64.0]
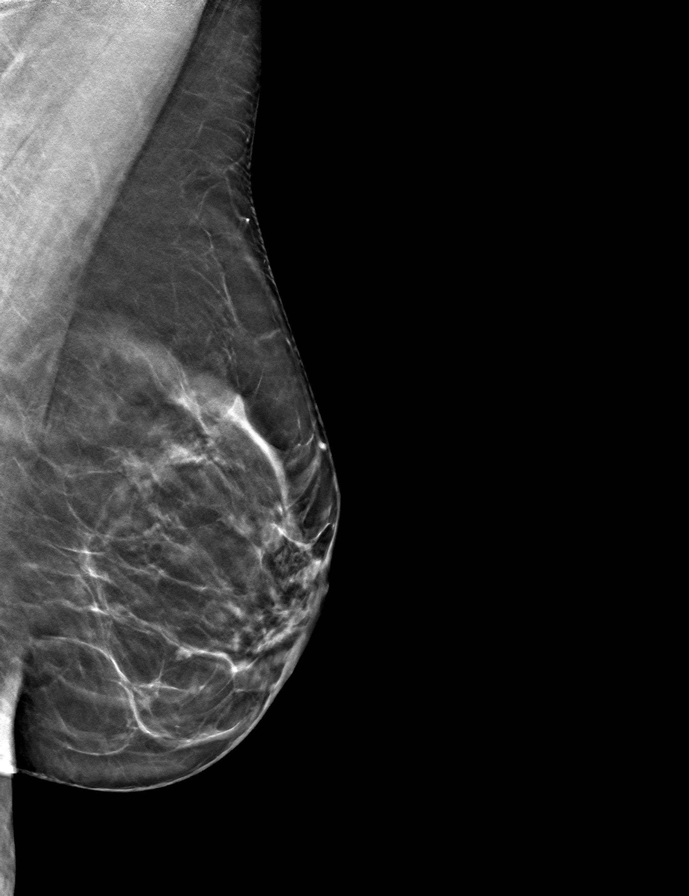

[R CC tomo · tomo slice 31/60.0]
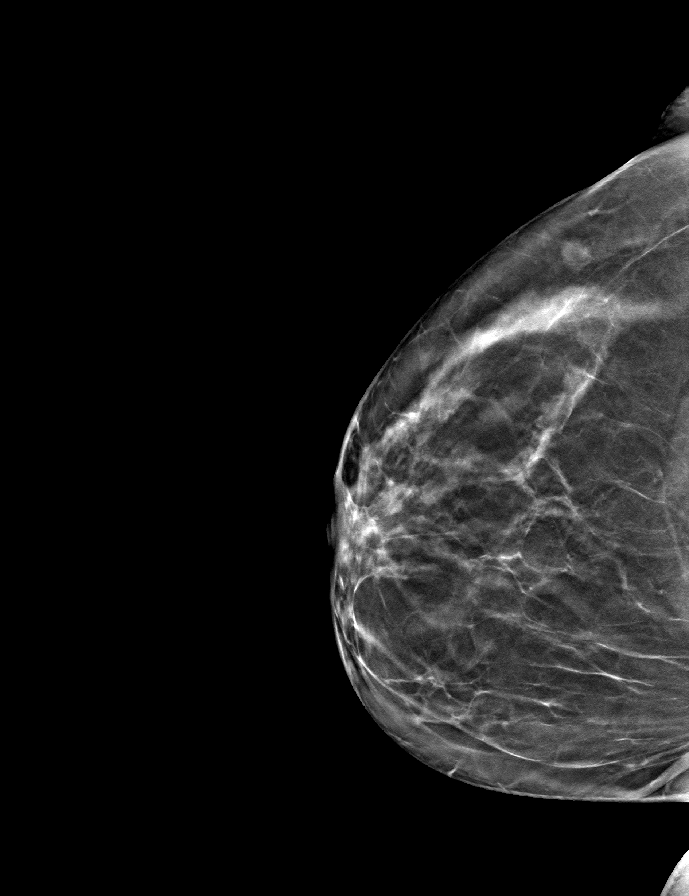

[9 of 24 positions shown; findings below may reference images not displayed]

ACR Breast Density Category b: There are scattered areas of
fibroglandular density.
FINDINGS: There are no findings suspicious for malignancy. Images were
processed with CAD.
IMPRESSION: No mammographic evidence of malignancy. A result letter of this
screening mammogram will be mailed directly to the patient.

RECOMMENDATION:
Screening mammogram in one year. (Code:[id number])

BI-RADS CATEGORY  1: Negative.

## 2021-07-07 ENCOUNTER — Other Ambulatory Visit: Payer: Self-pay | Admitting: Internal Medicine

## 2021-07-07 DIAGNOSIS — M76899 Other specified enthesopathies of unspecified lower limb, excluding foot: Secondary | ICD-10-CM

## 2021-07-12 ENCOUNTER — Other Ambulatory Visit: Payer: Self-pay | Admitting: Internal Medicine

## 2021-07-12 DIAGNOSIS — M87051 Idiopathic aseptic necrosis of right femur: Secondary | ICD-10-CM

## 2021-07-12 MED ORDER — DULOXETINE HCL 60 MG PO CPEP: ORAL_CAPSULE | ORAL | 3 refills | Status: DC

## 2021-07-29 ENCOUNTER — Other Ambulatory Visit: Payer: Self-pay | Admitting: Internal Medicine

## 2021-09-04 ENCOUNTER — Other Ambulatory Visit: Payer: Self-pay | Admitting: Internal Medicine

## 2021-09-04 MED ORDER — AZITHROMYCIN 250 MG PO TABS: ORAL_TABLET | ORAL | 1 refills | Status: DC

## 2021-09-04 MED ORDER — DEXAMETHASONE 1 MG PO TABS: ORAL_TABLET | ORAL | 0 refills | Status: DC

## 2021-09-04 MED ORDER — PSEUDOEPHEDRINE HCL ER 120 MG PO TB12: ORAL_TABLET | ORAL | 3 refills | Status: DC

## 2021-09-26 ENCOUNTER — Other Ambulatory Visit: Payer: Self-pay | Admitting: Nurse Practitioner

## 2021-09-26 NOTE — Progress Notes (Signed)
Pt is initiated on Veozah 45 mg 1 tab po qd #30 with 2 refills written prescription given

## 2021-09-29 ENCOUNTER — Other Ambulatory Visit: Payer: Self-pay | Admitting: Internal Medicine

## 2021-10-14 ENCOUNTER — Other Ambulatory Visit: Payer: Self-pay | Admitting: Internal Medicine

## 2021-11-05 ENCOUNTER — Other Ambulatory Visit: Payer: Self-pay | Admitting: Internal Medicine

## 2021-11-05 DIAGNOSIS — Z1231 Encounter for screening mammogram for malignant neoplasm of breast: Secondary | ICD-10-CM

## 2021-11-14 ENCOUNTER — Ambulatory Visit
Admission: RE | Admit: 2021-11-14 | Discharge: 2021-11-14 | Disposition: A | Discharge status: Home / Self Care | Payer: 59 | Source: Ambulatory Visit | Attending: Internal Medicine | Admitting: Internal Medicine

## 2021-11-14 DIAGNOSIS — Z1231 Encounter for screening mammogram for malignant neoplasm of breast: Secondary | ICD-10-CM

## 2021-11-20 ENCOUNTER — Other Ambulatory Visit: Payer: Self-pay | Admitting: Internal Medicine

## 2021-11-20 MED ORDER — MUPIROCIN 2 % EX OINT: TOPICAL_OINTMENT | CUTANEOUS | 3 refills | Status: AC

## 2021-11-21 ENCOUNTER — Other Ambulatory Visit: Payer: Self-pay | Admitting: Internal Medicine

## 2021-12-03 ENCOUNTER — Ambulatory Visit: Payer: 59 | Admitting: Nurse Practitioner

## 2021-12-10 ENCOUNTER — Other Ambulatory Visit: Payer: Self-pay | Admitting: Nurse Practitioner

## 2021-12-10 DIAGNOSIS — M76899 Other specified enthesopathies of unspecified lower limb, excluding foot: Secondary | ICD-10-CM

## 2021-12-24 ENCOUNTER — Encounter: Payer: Self-pay | Admitting: Nurse Practitioner

## 2021-12-24 ENCOUNTER — Other Ambulatory Visit: Payer: Self-pay | Admitting: Nurse Practitioner

## 2021-12-24 DIAGNOSIS — N951 Menopausal and female climacteric states: Secondary | ICD-10-CM | POA: Insufficient documentation

## 2021-12-24 MED ORDER — VEOZAH 45 MG PO TABS: 45.0000 mg | ORAL_TABLET | Freq: Every day | ORAL | 6 refills | Status: DC

## 2021-12-25 ENCOUNTER — Telehealth: Payer: Self-pay

## 2021-12-25 NOTE — Telephone Encounter (Signed)
Veozah prior auth completed and submitted last night.   Prior Josem Kaufmann was denied.

## 2021-12-29 ENCOUNTER — Other Ambulatory Visit: Payer: Self-pay | Admitting: Nurse Practitioner

## 2022-01-23 ENCOUNTER — Telehealth: Payer: Self-pay | Admitting: Internal Medicine

## 2022-01-23 ENCOUNTER — Other Ambulatory Visit: Payer: Self-pay | Admitting: Internal Medicine

## 2022-01-23 MED ORDER — PHENTERMINE HCL 37.5 MG PO TABS: ORAL_TABLET | ORAL | 1 refills | Status: DC

## 2022-01-23 MED ORDER — TOPIRAMATE 50 MG PO TABS: ORAL_TABLET | ORAL | 1 refills | Status: DC

## 2022-01-23 NOTE — Telephone Encounter (Signed)
Patient is requesting to try Phentermine for weightloss since can longer take [facility]. Please call to [facility] in [place].

## 2022-02-02 ENCOUNTER — Ambulatory Visit: Payer: 59 | Admitting: Nurse Practitioner

## 2022-02-13 ENCOUNTER — Other Ambulatory Visit: Payer: Self-pay | Admitting: Internal Medicine

## 2022-02-13 DIAGNOSIS — Z13820 Encounter for screening for osteoporosis: Secondary | ICD-10-CM

## 2022-02-13 DIAGNOSIS — N951 Menopausal and female climacteric states: Secondary | ICD-10-CM

## 2022-02-13 DIAGNOSIS — E2839 Other primary ovarian failure: Secondary | ICD-10-CM

## 2022-02-16 NOTE — Progress Notes (Deleted)
Follow UP  Assessment and Plan:  Duchess was seen today for annual exam.  Diagnoses and all orders for this visit:  Aseptic Necrosis of Both Hips/Osteoarthritis of both hips, unspecified osteoarthritis type/Bursitis of both hips, unspecified bursa Both hips replaced in the past year Continue to follow with ortho Will begin to wean off Cymbalta '40mg'$  qd x 7, 20 mg qd x 7 then 20 mg QOD x 7 days then stop  Hypertension, essential Continue current medications: Losartan '40mg'$  at night and bisoprolol '10mg'$  in the morning Monitor blood pressure at home; call if consistently over 130/80 Continue DASH diet.   Reminder to go to the ER if any CP, SOB, nausea, dizziness, severe HA, changes vision/speech, left arm numbness and tingling and jaw pain. - CBC - CMP  Abnormal glucose Continue diet and exercise A1c  Medication management Magnesium  Screening, ischemic heart disease EKG  Vitamin D deficiency Continue supplementation to maintain goal of 70-100 Taking Vitamin D  IU 15,000daily  vitamin D level  Gallbladder polyp 09/2020 last abdominal ultrasound- recommend surgical consult, pt declines  Will repeat U/S in 6 months Continue to monitor   Perimenopausal vasomotor symptoms Continue Veozah Check CMP  Colon Cancer Screening - Cologuard ordered  Discussed med's effects and SE's. Screening labs and tests as requested with regular follow-up as recommended. Over 40 minutes of interview, exam, counseling, chart review, and complex, high level critical decision making was performed this visit.   HPI  56 y.o. female  presents for a complete physical and follow up for has Osteopenia; Allergic rhinitis; Vitamin D deficiency; Gall bladder polyp; Shingles Left T4 Dermatome 07/02/2020; Essential hypertension; Aseptic necrosis of bone of both hips ([place]); and Hot flashes due to menopause on their problem list..  She had left hip replacement 09/2020 and right hip replacement 12/2020. Right does  still have some mild aching. Good ROM in both.   She is due for U/S recheck of GB polyp 2016 referral to general surgery , that was stable in , 2014 originally diagnosed in 2012. She has had NEG GGT labs and denies abdominal symptoms. Last U/S showed increase in size to 9 mm does not want surgical consult, will repeat U/S in 09/2021  She is currently on Mounjaro 5 mg weekly and has no side effects BMI is There is no height or weight on file to calculate BMI., she has been working on diet and exercise. Wt Readings from Last 3 Encounters:  06/05/21 117 lb (53.1 kg)  09/26/20 124 lb (56.2 kg)  09/13/20 143 lb (64.9 kg)   DERM: Family history of melanoma   Her blood pressure has been controlled at home, today their BP is   She does not workout. She denies chest pain, shortness of breath, dizziness.  She is check her blood pressure twice a day prior to medications.  She is ranging from 140's-150's over 60's-70's.  Sh is taking Losartan 25 mg  every evening and bisoprolol in the mornings. Denies any side effects from the medications. EKG: LBBB noted, last EKG 2016 Had previous cardiology consult, with stress test and echo normal   She is not on cholesterol medication and denies myalgias. Her cholesterol is at goal. The cholesterol last visit was:   Lab Results  Component Value Date   CHOL 215 (H) 06/05/2021   HDL 71 06/05/2021   LDLCALC 127 (H) 06/05/2021   TRIG 76 06/05/2021   CHOLHDL 3.0 06/05/2021    She has not been working on diet and  exercise for prediabetes, she is on bASA, she is on ACE/ARB and denies polydipsia, polyuria, visual disturbances, vomiting and weight loss. Last A1C in the office was:  Lab Results  Component Value Date   HGBA1C 5.0 06/05/2021    Last GFR: Lab Results  Component Value Date   GFRNONAA 91 10/09/2020   Lab Results  Component Value Date   GFRAA 105 10/09/2020    Patient is on Vitamin D supplement.   Lab Results  Component Value Date   VD25OH  118 (H) 06/05/2021      Current Medications:  Current Outpatient Medications on File Prior to Visit  Medication Sig Dispense Refill   acetaminophen (TYLENOL) 500 MG tablet Takes  4 tablets  every morning (Patient not taking: Reported on 06/05/2021) 1 tablet 0   aspirin 81 MG EC tablet Take 1 tablet (81 mg total) by mouth daily. Take  1 tablet Daily 365 tablet 0   Cholecalciferol (VITAMIN D3) 125 MCG (5000 UT) CAPS Take 2 capsules  (10,000 units)  Daily 30 capsule 0   docusate sodium (COLACE) 100 MG capsule Takes 2 capsules every morning 60 capsule 1   DULoxetine (CYMBALTA) 60 MG capsule Take  1 capsule  Daily  for Mood & Anxiety 90 capsule 3   fexofenadine (ALLEGRA) 180 MG tablet Take 1 tablet (180 mg total) by mouth daily. 90 tablet 3   losartan (COZAAR) 25 MG tablet TAKE 1 TABLET BY MOUTH DAILY FOR BLOOD PRESSURE 90 tablet 3   Magnesium 500 MG TABS Take 1 tablet Daily 30 tablet    MOUNJARO 5 MG/0.5ML Pen INJECT 5 MG ( 0.5 ML ) INTO THE SKIN ONCE WEEKLY 6 mL 1   mupirocin ointment (BACTROBAN) 2 % Apply 3 to 4 x /day 22 g 3   phentermine (ADIPEX-P) 37.5 MG tablet Take 1/2 to 1 tablet every Morning for Dieting & Weight Loss 90 tablet 1   pregabalin (LYRICA) 100 MG capsule TAKE 1 CAPSULE BY MOUTH THREE TIMES DAILY FOR CHRONIC PAIN 90 capsule 1   pseudoephedrine (SUDAFED) 120 MG 12 hr tablet Take  1 tablet  2 x /day (every 12 hours)  for Head and Chest Congestion 60 tablet 3   topiramate (TOPAMAX) 50 MG tablet Take 1/2 to 1 tablet 2 x /day at Suppertime & Bedtime for Dieting & Weight Loss 180 tablet 1   VEOZAH 45 MG TABS TAKE 1 TABLET BY MOUTH EVERY DAY 30 tablet 6   No current facility-administered medications on file prior to visit.   Allergies:  Allergies  Allergen Reactions   Influenza Vaccines    Medical History:  She has Osteopenia; Allergic rhinitis; Vitamin D deficiency; Gall bladder polyp; Shingles Left T4 Dermatome 07/02/2020; Essential hypertension; Aseptic necrosis of bone of both  hips ([place]); and Hot flashes due to menopause on their problem list. Health Maintenance:   Immunization History  Administered Date(s) Administered   Influenza Inj Mdck Quad With Preservative 03/01/2017, 02/25/2018, 02/07/2019, 02/23/2020   Influenza,inj,Quad PF,6+ Mos 02/20/2021   PFIZER(Purple Top)SARS-COV-2 Vaccination 11/24/2019, 12/15/2019, 12/15/2019   PPD Test 06/08/2014   Tdap 05/29/2008, 12/08/2018    Vaccinations: TD or Tdap: 11/2018  Influenza: 10/21 Pneumococcal: N/A Shingles: Zostavax/Shingrix: Discussed with patient   Preventative Care: Last colonoscopy: DUE Last mammogram: 08/2020 Last pap smear/pelvic exam: DUE   DEXA:N/A Hep C screening (6384-6659): Completed today    Names of Other Physician/Practitioners you currently use: 1. [facility] here for primary care 2. Eye Exam: 2022  3. Dental Exam 2021, schedule for 2022   Patient Care Team: Unk Pinto, MD as PCP - General (Internal Medicine) Maisie Fus, MD (Inactive) as Consulting Physician (Obstetrics and Gynecology)  Surgical History:  She has a past surgical history that includes Cesarean section; Novasure ablation (2007); Wisdom tooth extraction; and Skin surgery (Left). Family History:  Herfamily history includes Aneurysm in her maternal grandmother; Breast cancer in her mother; Cancer in her maternal aunt and paternal uncle; Cancer (age of onset: 15) in her father; Colon polyps in her sister; Heart disease in her maternal grandfather; Hyperlipidemia in her maternal grandfather and mother; Hypertension in her mother; Osteopenia in her mother; Stroke in her maternal grandmother. Social History:  She reports that she quit smoking about 29 years ago. She has never used smokeless tobacco. She reports current alcohol use. She reports that she does not use drugs.  Review of Systems: Review of Systems  Constitutional:  Negative for chills and fever.  HENT:   Negative for congestion, hearing loss, sinus pain, sore throat and tinnitus.   Eyes:  Negative for blurred vision and double vision.  Respiratory:  Negative for cough, hemoptysis, sputum production, shortness of breath and wheezing.   Cardiovascular:  Negative for chest pain, palpitations and leg swelling.  Gastrointestinal:  Negative for abdominal pain, constipation, diarrhea, heartburn, nausea and vomiting.  Genitourinary:  Negative for dysuria and urgency.  Musculoskeletal:  Negative for back pain, falls, joint pain, myalgias and neck pain.  Skin:  Negative for rash.  Neurological:  Negative for dizziness, tingling, tremors, weakness and headaches.  Endo/Heme/Allergies:  Does not bruise/bleed easily.  Psychiatric/Behavioral:  Negative for depression and suicidal ideas. The patient is not nervous/anxious and does not have insomnia.     Physical Exam: Estimated body mass index is 19.47 kg/m as calculated from the following:   Height as of 06/05/21: '5\' 5"'$  (1.651 m).   Weight as of 06/05/21: 117 lb (53.1 kg). There were no vitals taken for this visit. General Appearance: Well nourished, in no apparent distress.  Eyes: PERRLA, EOMs, conjunctiva no swelling or erythema, normal fundi and vessels.  Sinuses: No Frontal/maxillary tenderness  ENT/Mouth: Ext aud canals clear, normal light reflex with TMs without erythema, bulging. Good dentition. No erythema, swelling, or exudate on post pharynx. Tonsils not swollen or erythematous. Hearing normal.  Neck: Supple, thyroid normal. No bruits  Respiratory: Respiratory effort normal, BS equal bilaterally without rales, rhonchi, wheezing or stridor.  Cardio: RRR without murmurs, rubs or gallops. Brisk peripheral pulses without edema.  Chest: symmetric, with normal excursions and percussion.  Breasts: Symmetric, without lumps, nipple discharge, retractions.  Abdomen: Soft, nontender, no guarding, rebound, hernias, masses, or organomegaly.  Lymphatics: Non  tender without lymphadenopathy.  Genitourinary:  Musculoskeletal: Full ROM all peripheral extremities,5/5 strength, and normal gait. Right upper leg/hip slightly decreased strength Skin: Warm, dry without rashes, lesions, ecchymosis. Neuro: Cranial nerves intact, reflexes equal bilaterally. Normal muscle tone, no cerebellar symptoms. Sensation intact.  Psych: Awake and oriented X 3, normal affect, Insight and Judgment appropriate.    EKG: NSR no ST changes.   Magda Bernheim ANP-C  [facility]  02/16/2022

## 2022-02-18 ENCOUNTER — Ambulatory Visit: Payer: 59 | Admitting: Nurse Practitioner

## 2022-02-23 ENCOUNTER — Ambulatory Visit (HOSPITAL_BASED_OUTPATIENT_CLINIC_OR_DEPARTMENT_OTHER)
Admission: RE | Admit: 2022-02-23 | Discharge: 2022-02-23 | Disposition: A | Discharge status: Home / Self Care | Payer: 59 | Source: Ambulatory Visit | Attending: Internal Medicine | Admitting: Internal Medicine

## 2022-02-23 DIAGNOSIS — Z13820 Encounter for screening for osteoporosis: Secondary | ICD-10-CM | POA: Insufficient documentation

## 2022-02-23 DIAGNOSIS — E2839 Other primary ovarian failure: Secondary | ICD-10-CM | POA: Insufficient documentation

## 2022-02-23 DIAGNOSIS — N951 Menopausal and female climacteric states: Secondary | ICD-10-CM | POA: Insufficient documentation

## 2022-02-23 NOTE — Progress Notes (Signed)
<><><><><><><><><><><><><><><><><><><><><><><><><><><><><><><><><> <><><><><><><><><><><><><><><><><><><><><><><><><><><><><><><><><>  -   Bone Density shows very mild Osteopenia  ( Fortunately NOT  Osteoporosis ! )   - Recommendations are continue , exercise Daily &                      take extra calcium 500-600 mg / day ( Tums or store brand is OK  )   - Also recommend take Vitamin K 2 ( MK-7) &   - Also recommend repeat Bone Density in 2 years.    <><><><><><><><><><><><><><><><><><><><><><><><><><><><><><><><><> <><><><><><><><><><><><><><><><><><><><><><><><><><><><><><><><><>  -

## 2022-03-03 NOTE — Progress Notes (Unsigned)
Follow UP  Assessment and Plan:  Yvonne Bell was seen today for annual exam.  Diagnoses and all orders for this visit:  Yvonne Bell was seen today for follow-up.  Diagnoses and all orders for this visit:  Screening for cervical cancer -     PAP, TP IMAGING, WNL RFLX HPV  Essential hypertension - continue medications, DASH diet, exercise and monitor at home. Call if greater than 130/80.   -     CBC with Differential/Platelet  Hyperlipidemia, mixed Continue diet and exercise -     COMPLETE METABOLIC PANEL WITH GFR -     Lipid panel  Hot flashes due to menopause Continue Veozah Check CMP for liver functions  Gallbladder polyp Repeat U/S ordered to check stability -     US Abdomen Complete; Future  Seasonal allergic rhinitis due to pollen Continue Allegra and begin flonase daily -     fluticasone (FLONASE) 50 MCG/ACT nasal spray; Place 1 spray into both nostrils daily.  Flu vaccine need -     Flu Vaccine QUAD 6+ mos PF IM (Fluarix Quad PF)  Eczema, unspecified type Eczema patches noted on left ear, right lower leg  and left buttock- apply triamcinolone cream BID as needed -     triamcinolone ointment (KENALOG) 0.1 %; Apply 1 Application topically 2 (two) times daily.      Discussed med's effects and SE's. Screening labs and tests as requested with regular follow-up as recommended. Over 40 minutes of interview, exam, counseling, chart review, and complex, high level critical decision making was performed this visit.   HPI  56 y.o. female  presents for a complete physical and follow up for has Osteopenia; Allergic rhinitis; Vitamin D deficiency; Gall bladder polyp; Shingles Left T4 Dermatome 07/02/2020; Essential hypertension; Aseptic necrosis of bone of both hips ([place]); Hot flashes due to menopause; and Hyperlipidemia, mixed on their problem list..  She had left hip replacement 09/2020 and right hip replacement 12/2020. Right does still have some mild aching. Good ROM in both.  She  is having sinus congestion, sore throat, headache which started yesterday  Denies fever, nausea, vomiting and diarrhea.   She was previously started on Veozah for hot flashes and is having good results   She is due for U/S recheck of GB polyp 2016 referral to general surgery , that was stable in , 2014 originally diagnosed in 2012. She has had NEG GGT labs and denies abdominal symptoms. Last U/S showed increase in size to 9 mm does not want surgical consult, will order repeat today  She is overdue for PAP smear and does want to have done today   BMI is Body mass index is 20.14 kg/m., she has been working on diet and exercise. Wt Readings from Last 3 Encounters:  03/04/22 121 lb (54.9 kg)  06/05/21 117 lb (53.1 kg)  09/26/20 124 lb (56.2 kg)   DERM: Family history of melanoma  DEXA 02/23/22 showed osteopenia. Recommended taking VIT D3 K2(m7) and Calcium 600 mg QD.    Her blood pressure has been controlled at home, today their BP is BP: 128/62  BP Readings from Last 3 Encounters:  03/04/22 128/62  06/05/21 110/62  09/26/20 138/84  She does not workout. She denies chest pain, shortness of breath, dizziness.  She is check her blood pressure twice a day prior to medications.  She is ranging from 140's-150's over 60's-70's.  Sh is taking Losartan 25 mg  every evening . Denies any side effects from the medications. Had  previous cardiology consult, with stress test and echo normal   She is not on cholesterol medication and denies myalgias. Her cholesterol is not at goal. The cholesterol last visit was:   Lab Results  Component Value Date   CHOL 215 (H) 06/05/2021   HDL 71 06/05/2021   LDLCALC 127 (H) 06/05/2021   TRIG 76 06/05/2021   CHOLHDL 3.0 06/05/2021    She has not been working on diet and exercise for prediabetes, she is on bASA, she is on ACE/ARB and denies polydipsia, polyuria, visual disturbances, vomiting and weight loss. Last A1C in the office was:  Lab Results   Component Value Date   HGBA1C 5.0 06/05/2021    Last GFR: Lab Results  Component Value Date   EGFR 82 06/05/2021      Patient is on Vitamin D supplement.   Lab Results  Component Value Date   VD25OH 118 (H) 06/05/2021      Current Medications:  Current Outpatient Medications on File Prior to Visit  Medication Sig Dispense Refill   Cholecalciferol (VITAMIN D3) 125 MCG (5000 UT) CAPS Take 2 capsules  (10,000 units)  Daily 30 capsule 0   DULoxetine (CYMBALTA) 60 MG capsule Take  1 capsule  Daily  for Mood & Anxiety 90 capsule 3   fexofenadine (ALLEGRA) 180 MG tablet Take 1 tablet (180 mg total) by mouth daily. 90 tablet 3   losartan (COZAAR) 25 MG tablet TAKE 1 TABLET BY MOUTH DAILY FOR BLOOD PRESSURE 90 tablet 3   phentermine (ADIPEX-P) 37.5 MG tablet Take 1/2 to 1 tablet every Morning for Dieting & Weight Loss 90 tablet 1   pregabalin (LYRICA) 100 MG capsule TAKE 1 CAPSULE BY MOUTH THREE TIMES DAILY FOR CHRONIC PAIN 90 capsule 1   topiramate (TOPAMAX) 50 MG tablet Take 1/2 to 1 tablet 2 x /day at Suppertime & Bedtime for Dieting & Weight Loss 180 tablet 1   VEOZAH 45 MG TABS TAKE 1 TABLET BY MOUTH EVERY DAY 30 tablet 6   mupirocin ointment (BACTROBAN) 2 % Apply 3 to 4 x /day 22 g 3   No current facility-administered medications on file prior to visit.   Allergies:  Allergies  Allergen Reactions   Influenza Vaccines    Medical History:  She has Osteopenia; Allergic rhinitis; Vitamin D deficiency; Gall bladder polyp; Shingles Left T4 Dermatome 07/02/2020; Essential hypertension; Aseptic necrosis of bone of both hips ([place]); Hot flashes due to menopause; and Hyperlipidemia, mixed on their problem list. Health Maintenance:   Immunization History  Administered Date(s) Administered   Influenza Inj Mdck Quad With Preservative 03/01/2017, 02/25/2018, 02/07/2019, 02/23/2020   Influenza,inj,Quad PF,6+ Mos 02/20/2021   PFIZER(Purple Top)SARS-COV-2 Vaccination 11/24/2019, 12/15/2019,  12/15/2019   PPD Test 06/08/2014   Tdap 05/29/2008, 12/08/2018    Vaccinations: TD or Tdap: 11/2018  Influenza: 10/21 Pneumococcal: N/A Shingles: Zostavax/Shingrix: Discussed with patient   Preventative Care: Last colonoscopy: DUE Last mammogram: 08/2020 Last pap smear/pelvic exam: DUE   DEXA:N/A Hep C screening (6378-5885): Completed today    Names of Other Physician/Practitioners you currently use: 1. [facility] here for primary care 2. Eye Exam: 2022 3. Dental Exam 2021, schedule for 2022   Patient Care Team: Unk Pinto, MD as PCP - General (Internal Medicine) Maisie Fus, MD (Inactive) as Consulting Physician (Obstetrics and Gynecology)  Surgical History:  She has a past surgical history that includes Cesarean section; Novasure ablation (2007); Wisdom tooth extraction; and Skin surgery (Left). Family History:  Herfamily history includes Aneurysm in her maternal grandmother; Breast cancer in her mother; Cancer in her maternal aunt and paternal uncle; Cancer (age of onset: 12) in her father; Colon polyps in her sister; Heart disease in her maternal grandfather; Hyperlipidemia in her maternal grandfather and mother; Hypertension in her mother; Osteopenia in her mother; Stroke in her maternal grandmother. Social History:  She reports that she quit smoking about 29 years ago. Her smoking use included cigarettes. She has never used smokeless tobacco. She reports current alcohol use. She reports that she does not use drugs.  Review of Systems: Review of Systems  Constitutional:  Negative for chills and fever.  HENT:  Positive for congestion and sinus pain. Negative for hearing loss, sore throat and tinnitus.   Eyes:  Negative for blurred vision and double vision.  Respiratory:  Negative for cough, hemoptysis, sputum production, shortness of breath and wheezing.   Cardiovascular:  Negative for chest pain, palpitations and leg  swelling.  Gastrointestinal:  Negative for abdominal pain, constipation, diarrhea, heartburn, nausea and vomiting.  Genitourinary:  Negative for dysuria and urgency.  Musculoskeletal:  Negative for back pain, falls, joint pain, myalgias and neck pain.  Skin:  Negative for rash.  Neurological:  Negative for dizziness, tingling, tremors, weakness and headaches.  Endo/Heme/Allergies:  Does not bruise/bleed easily.  Psychiatric/Behavioral:  Negative for depression and suicidal ideas. The patient is not nervous/anxious and does not have insomnia.     Physical Exam: Estimated body mass index is 20.14 kg/m as calculated from the following:   Height as of this encounter: _0  (1.651 m).   Weight as of this encounter: 121 lb (54.9 kg). BP 128/62   Pulse 100   Temp (!) 97.5 F (36.4 C)   Ht _1  (1.651 m)   Wt 121 lb (54.9 kg)   SpO2 98%   BMI 20.14 kg/m  General Appearance: Well nourished, in no apparent distress.  Eyes: PERRLA, EOMs, conjunctiva no swelling or erythema, normal fundi and vessels.  Sinuses: No Frontal/maxillary tenderness  ENT/Mouth: Ext aud canals clear, normal light reflex with TMs without erythema, bulging. Good dentition. No erythema, swelling, or exudate on post pharynx. Tonsils not swollen or erythematous. Hearing normal. Nasal mucosa is pale Neck: Supple, thyroid normal. No bruits  Respiratory: Respiratory effort normal, BS equal bilaterally without rales, rhonchi, wheezing or stridor.  Cardio: RRR without murmurs, rubs or gallops. Brisk peripheral pulses without edema.  Chest: symmetric, with normal excursions and percussion.  Breasts: Symmetric, without lumps, nipple discharge, retractions.  Abdomen: Soft, nontender, no guarding, rebound, hernias, masses, or organomegaly.  Lymphatics: Non tender without lymphadenopathy.  Pelvic exam: normal external genitalia, vulva, vagina, cervix, uterus and adnexa, PAP: Pap smear done today, thin-prep method.  Musculoskeletal:  Full ROM all peripheral extremities,5/5 strength, and normal gait. Right upper leg/hip slightly decreased strength Skin: Warm, dry . Eczema patch noted of left buttock, left ear and right lower leg. Neuro: Cranial nerves intact, reflexes equal bilaterally. Normal muscle tone, no cerebellar symptoms. Sensation intact.  Psych: Awake and oriented X 3, normal affect, Insight and Judgment appropriate.      Magda Bernheim ANP-C  [facility]  03/04/2022

## 2022-03-04 ENCOUNTER — Ambulatory Visit (INDEPENDENT_AMBULATORY_CARE_PROVIDER_SITE_OTHER): Payer: 59 | Admitting: Nurse Practitioner

## 2022-03-04 ENCOUNTER — Encounter: Payer: Self-pay | Admitting: Nurse Practitioner

## 2022-03-04 VITALS — BP 128/62 | HR 100 | Temp 97.5°F | Ht 65.0 in | Wt 121.0 lb

## 2022-03-04 DIAGNOSIS — Z124 Encounter for screening for malignant neoplasm of cervix: Secondary | ICD-10-CM

## 2022-03-04 DIAGNOSIS — Z1152 Encounter for screening for COVID-19: Secondary | ICD-10-CM

## 2022-03-04 DIAGNOSIS — Z23 Encounter for immunization: Secondary | ICD-10-CM | POA: Diagnosis not present

## 2022-03-04 DIAGNOSIS — N951 Menopausal and female climacteric states: Secondary | ICD-10-CM | POA: Diagnosis not present

## 2022-03-04 DIAGNOSIS — J301 Allergic rhinitis due to pollen: Secondary | ICD-10-CM

## 2022-03-04 DIAGNOSIS — L309 Dermatitis, unspecified: Secondary | ICD-10-CM

## 2022-03-04 DIAGNOSIS — E559 Vitamin D deficiency, unspecified: Secondary | ICD-10-CM

## 2022-03-04 DIAGNOSIS — I10 Essential (primary) hypertension: Secondary | ICD-10-CM

## 2022-03-04 DIAGNOSIS — K824 Cholesterolosis of gallbladder: Secondary | ICD-10-CM | POA: Diagnosis not present

## 2022-03-04 DIAGNOSIS — E782 Mixed hyperlipidemia: Secondary | ICD-10-CM | POA: Diagnosis not present

## 2022-03-04 LAB — POC COVID19 BINAXNOW: SARS Coronavirus 2 Ag: NEGATIVE

## 2022-03-04 MED ORDER — FLUTICASONE PROPIONATE 50 MCG/ACT NA SUSP: 1.0000 | Freq: Every day | NASAL | 2 refills | Status: DC

## 2022-03-04 MED ORDER — TRIAMCINOLONE ACETONIDE 0.1 % EX OINT: 1.0000 | TOPICAL_OINTMENT | Freq: Two times a day (BID) | CUTANEOUS | 1 refills | Status: DC

## 2022-03-04 NOTE — Patient Instructions (Signed)
I have put in an order for a follow up ultrasound of your gallbladder  YOU CAN CALL TO MAKE AN ULTRASOUND..  I have put in an order for an ultrasound for you to have You can set them up at your convenience by calling this number [phone] You will likely have the ultrasound at [place]  If you have any issues call our office and we will set this up for you.

## 2022-03-05 LAB — COMPLETE METABOLIC PANEL WITH GFR
AG Ratio: 2 (calc) (ref 1.0–2.5)
ALT: 36 U/L — ABNORMAL HIGH (ref 6–29)
AST: 28 U/L (ref 10–35)
Albumin: 4.7 g/dL (ref 3.6–5.1)
Alkaline phosphatase (APISO): 96 U/L (ref 37–153)
BUN: 13 mg/dL (ref 7–25)
CO2: 28 mmol/L (ref 20–32)
Calcium: 10.1 mg/dL (ref 8.6–10.4)
Chloride: 105 mmol/L (ref 98–110)
Creat: 0.99 mg/dL (ref 0.50–1.03)
Globulin: 2.3 g/dL (calc) (ref 1.9–3.7)
Glucose, Bld: 102 mg/dL — ABNORMAL HIGH (ref 65–99)
Potassium: 4.6 mmol/L (ref 3.5–5.3)
Sodium: 143 mmol/L (ref 135–146)
Total Bilirubin: 0.3 mg/dL (ref 0.2–1.2)
Total Protein: 7 g/dL (ref 6.1–8.1)
eGFR: 67 mL/min/{1.73_m2} (ref >=60)

## 2022-03-05 LAB — PAP, TP IMAGING W/ HPV RNA, RFLX HPV TYPE 16,18/45: HPV DNA High Risk: NOT DETECTED

## 2022-03-05 LAB — CBC WITH DIFFERENTIAL/PLATELET
Absolute Monocytes: 533 cells/uL (ref 200–950)
Basophils Absolute: 82 cells/uL (ref 0–200)
Basophils Relative: 1 %
Eosinophils Absolute: 213 cells/uL (ref 15–500)
Eosinophils Relative: 2.6 %
HCT: 45.5 % — ABNORMAL HIGH (ref 35.0–45.0)
Hemoglobin: 15.3 g/dL (ref 11.7–15.5)
Lymphs Abs: 1501 cells/uL (ref 850–3900)
MCH: 32.8 pg (ref 27.0–33.0)
MCHC: 33.6 g/dL (ref 32.0–36.0)
MCV: 97.4 fL (ref 80.0–100.0)
MPV: 11.2 fL (ref 7.5–12.5)
Monocytes Relative: 6.5 %
Neutro Abs: 5871 cells/uL (ref 1500–7800)
Neutrophils Relative %: 71.6 %
Platelets: 270 10*3/uL (ref 140–400)
RBC: 4.67 10*6/uL (ref 3.80–5.10)
RDW: 11.9 % (ref 11.0–15.0)
Total Lymphocyte: 18.3 %
WBC: 8.2 10*3/uL (ref 3.8–10.8)

## 2022-03-05 LAB — LIPID PANEL
Cholesterol: 226 mg/dL — ABNORMAL HIGH (ref <200)
HDL: 91 mg/dL (ref >=50)
LDL Cholesterol (Calc): 116 mg/dL (calc) — ABNORMAL HIGH
Non-HDL Cholesterol (Calc): 135 mg/dL (calc) — ABNORMAL HIGH (ref <130)
Total CHOL/HDL Ratio: 2.5 (calc) (ref <5.0)
Triglycerides: 88 mg/dL (ref <150)

## 2022-03-05 LAB — PAP, TP IMAGING, WNL RFLX HPV

## 2022-03-05 LAB — VITAMIN D 25 HYDROXY (VIT D DEFICIENCY, FRACTURES): Vit D, 25-Hydroxy: 46 ng/mL (ref 30–100)

## 2022-03-22 ENCOUNTER — Other Ambulatory Visit: Payer: Self-pay | Admitting: Nurse Practitioner

## 2022-03-22 DIAGNOSIS — M76899 Other specified enthesopathies of unspecified lower limb, excluding foot: Secondary | ICD-10-CM

## 2022-03-23 ENCOUNTER — Other Ambulatory Visit: Payer: Self-pay | Admitting: Internal Medicine

## 2022-03-27 ENCOUNTER — Ambulatory Visit (HOSPITAL_BASED_OUTPATIENT_CLINIC_OR_DEPARTMENT_OTHER)
Admission: RE | Admit: 2022-03-27 | Discharge: 2022-03-27 | Disposition: A | Discharge status: Home / Self Care | Payer: 59 | Source: Ambulatory Visit | Attending: Nurse Practitioner | Admitting: Nurse Practitioner

## 2022-03-27 ENCOUNTER — Other Ambulatory Visit: Payer: Self-pay | Admitting: Nurse Practitioner

## 2022-03-27 DIAGNOSIS — I10 Essential (primary) hypertension: Secondary | ICD-10-CM

## 2022-03-27 DIAGNOSIS — Z124 Encounter for screening for malignant neoplasm of cervix: Secondary | ICD-10-CM

## 2022-03-27 DIAGNOSIS — N951 Menopausal and female climacteric states: Secondary | ICD-10-CM

## 2022-03-27 DIAGNOSIS — Z23 Encounter for immunization: Secondary | ICD-10-CM

## 2022-03-27 DIAGNOSIS — K824 Cholesterolosis of gallbladder: Secondary | ICD-10-CM

## 2022-03-27 DIAGNOSIS — E782 Mixed hyperlipidemia: Secondary | ICD-10-CM

## 2022-03-27 DIAGNOSIS — J301 Allergic rhinitis due to pollen: Secondary | ICD-10-CM

## 2022-03-27 DIAGNOSIS — E559 Vitamin D deficiency, unspecified: Secondary | ICD-10-CM

## 2022-03-27 DIAGNOSIS — L309 Dermatitis, unspecified: Secondary | ICD-10-CM

## 2022-03-27 DIAGNOSIS — Z1152 Encounter for screening for COVID-19: Secondary | ICD-10-CM

## 2022-03-27 NOTE — Progress Notes (Signed)
Please advise patient gallbladder polyp remained same size and recommend repeat in 1 year

## 2022-04-23 ENCOUNTER — Other Ambulatory Visit: Payer: Self-pay | Admitting: Internal Medicine

## 2022-04-23 MED ORDER — DEXAMETHASONE 4 MG PO TABS: ORAL_TABLET | ORAL | 0 refills | Status: DC

## 2022-05-01 ENCOUNTER — Other Ambulatory Visit: Payer: Self-pay | Admitting: Internal Medicine

## 2022-05-08 ENCOUNTER — Other Ambulatory Visit: Payer: Self-pay | Admitting: Internal Medicine

## 2022-05-08 DIAGNOSIS — M76899 Other specified enthesopathies of unspecified lower limb, excluding foot: Secondary | ICD-10-CM

## 2022-05-08 MED ORDER — PREGABALIN 100 MG PO CAPS: ORAL_CAPSULE | ORAL | 1 refills | Status: DC

## 2022-06-03 NOTE — Progress Notes (Deleted)
Complete Physical  Assessment and Plan:  Yvonne Bell was seen today for annual exam.  Diagnoses and all orders for this visit: Encounter for routine adult medical exam with abnormal findings Yearly Due for Mammogram Colonoscopy Pap  Aseptic Necrosis of Both Hips/Osteoarthritis of both hips, unspecified osteoarthritis type/Bursitis of both hips, unspecified bursa Both hips replaced in the past year Continue to follow with ortho Will begin to wean off Cymbalta '40mg'$  qd x 7, 20 mg qd x 7 then 20 mg QOD x 7 days then stop  Hypertension, essential Continue current medications: Losartan '40mg'$  at night and bisoprolol '10mg'$  in the morning Monitor blood pressure at home; call if consistently over 130/80 Continue DASH diet.   Reminder to go to the ER if any CP, SOB, nausea, dizziness, severe HA, changes vision/speech, left arm numbness and tingling and jaw pain. - CBC - CMP  Abnormal glucose Continue diet and exercise A1c  Medication management Magnesium  Screening, ischemic heart disease EKG  Vitamin D deficiency Continue supplementation to maintain goal of 70-100 Taking Vitamin D  IU 15,000daily  vitamin D level  Gallbladder polyp 09/2020 last abdominal ultrasound- recommend surgical consult, pt declines  Will repeat U/S in 6 months Continue to monitor  Screening for thyroid disorder -     TSH  Screening cholesterol level -     Lipid panel  Abnormal Glucose Continue diet and exercise -     Hemoglobin A1c   Screening for blood or protein in urine -     Urinalysis w reflex microscopic - Microalbumin/creatinine urine ratio  Screening for AAA - U/S ABD Retroperitoneal LTD  Colon Cancer Screening - Cologuard ordered  Discussed med's effects and SE's. Screening labs and tests as requested with regular follow-up as recommended. Over 40 minutes of interview, exam, counseling, chart review, and complex, high level critical decision making was performed this visit.   HPI   57 y.o. 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  She had left hip replacement 09/2020 and right hip replacement 12/2020. Right does still have some mild aching. Good ROM in both.   She is due for U/S recheck of GB polyp 2016 referral to general surgery , that was stable in , 2014 originally diagnosed in 2012. She has had NEG GGT labs and denies abdominal symptoms. Last U/S showed increase in size to 9 mm does not want surgical consult, will repeat U/S in 09/2021  She is currently on Mounjaro 5 mg weekly and has no side effects BMI is There is no height or weight on file to calculate BMI., she has been working on diet and exercise. Wt Readings from Last 3 Encounters:  03/04/22 121 lb (54.9 kg)  06/05/21 117 lb (53.1 kg)  09/26/20 124 lb (56.2 kg)   DERM: Family history of melanoma   Her blood pressure has been controlled at home, today their BP is   She does not workout. She denies chest pain, shortness of breath, dizziness.  She is check her blood pressure twice a day prior to medications.  She is ranging from 140's-150's over 60's-70's.  Sh is taking Losartan 25 mg  every evening and bisoprolol in the mornings. Denies any side effects from the medications. EKG: LBBB noted, last EKG 2016 Had previous  cardiology consult, with stress test and echo normal   She is not on cholesterol medication and denies myalgias. Her cholesterol is at goal. The cholesterol last visit was:   Lab Results  Component Value Date   CHOL 226 (H) 03/04/2022   HDL 91 03/04/2022   LDLCALC 116 (H) 03/04/2022   TRIG 88 03/04/2022   CHOLHDL 2.5 03/04/2022    She has not been working on diet and exercise for prediabetes, she is on bASA,  she is on ACE/ARB and denies polydipsia, polyuria, visual disturbances, vomiting and weight loss. Last A1C in the office was:  Lab Results  Component Value Date   HGBA1C 5.0 06/05/2021    Last GFR: Lab Results  Component Value Date   GFRNONAA 91 10/09/2020   Lab Results  Component Value Date   GFRAA 105 10/09/2020    Patient is on Vitamin D supplement.   Lab Results  Component Value Date   VD25OH 46 03/04/2022      Current Medications:  Current Outpatient Medications on File Prior to Visit  Medication Sig Dispense Refill   dexamethasone (DECADRON) 4 MG tablet Take 1 tab 3 x day - 3 days, then 2 x day - 3 days, then 1 tab daily 20 tablet 0   Cholecalciferol (VITAMIN D3) 125 MCG (5000 UT) CAPS Take 2 capsules  (10,000 units)  Daily 30 capsule 0   DULoxetine (CYMBALTA) 60 MG capsule Take  1 capsule  Daily  for Mood & Anxiety 90 capsule 3   fexofenadine (ALLEGRA) 180 MG tablet Take 1 tablet (180 mg total) by mouth daily. 90 tablet 3   fluticasone (FLONASE) 50 MCG/ACT nasal spray Place 1 spray into both nostrils daily. 18.2 mL 2   losartan (COZAAR) 25 MG tablet TAKE 1 TABLET BY MOUTH DAILY FOR BLOOD PRESSURE 90 tablet 3   mupirocin ointment (BACTROBAN) 2 % Apply 3 to 4 x /day 22 g 3   phentermine (ADIPEX-P) 37.5 MG tablet Take 1/2 to 1 tablet every Morning for Dieting & Weight Loss 90 tablet 1   pregabalin (LYRICA) 100 MG capsule Take 1 capsule 3 x /day for Chronic Pain 270 capsule 1   topiramate (TOPAMAX) 50 MG tablet TAKE 1/2 TO 1 TABLET BY MOUTH TWICE DAILY AT SUPPERTIME AND BEDTIME FOR DIETING AND WEIGHT LOSS 180 tablet 1   triamcinolone ointment (KENALOG) 0.1 % Apply 1 Application topically 2 (two) times daily. 80 g 1   VEOZAH 45 MG TABS TAKE 1 TABLET BY MOUTH EVERY DAY 30 tablet 6   No current facility-administered medications on file prior to visit.   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 Health Maintenance:   Immunization History  Administered Date(s) Administered   Influenza Inj Mdck Quad With Preservative 03/01/2017, 02/25/2018, 02/07/2019, 02/23/2020   Influenza,inj,Quad PF,6+ Mos 02/20/2021, 03/04/2022   PFIZER(Purple Top)SARS-COV-2 Vaccination 11/24/2019, 12/15/2019, 12/15/2019   PPD Test 06/08/2014   Tdap 05/29/2008, 12/08/2018    Vaccinations: TD or Tdap: 11/2018  Influenza: 10/21 Pneumococcal: N/A Shingles: Zostavax/Shingrix: Discussed with patient   Preventative Care: Last colonoscopy: DUE Last mammogram: 08/2020 Last pap smear/pelvic exam: DUE   DEXA:N/A Hep C screening (2440-1027): Completed today    Names of Other Physician/Practitioners you currently  use: 1. [facility] here for primary care 2. Eye Exam: 2022 3. Dental Exam 2021, schedule for 2022   Patient Care Team: Unk Pinto, MD as PCP - General (Internal Medicine) Maisie Fus, MD (Inactive) as Consulting Physician (Obstetrics and Gynecology)  Surgical History:  She has a past surgical history that includes Cesarean section; Novasure ablation (2007); Wisdom tooth extraction; and Skin surgery (Left). Family History:  Herfamily history includes Aneurysm in her maternal grandmother; Breast cancer in her mother; Cancer in her maternal aunt and paternal uncle; Cancer (age of onset: 54) in her father; Colon polyps in her sister; Heart disease in her maternal grandfather; Hyperlipidemia in her maternal grandfather and mother; Hypertension in her mother; Osteopenia in her mother; Stroke in her maternal grandmother. Social History:  She reports that she quit smoking about 29 years ago. Her smoking use included cigarettes.  She has never used smokeless tobacco. She reports current alcohol use. She reports that she does not use drugs.  Review of Systems: Review of Systems  Constitutional:  Negative for chills and fever.  HENT:  Negative for congestion, hearing loss, sinus pain, sore throat and tinnitus.   Eyes:  Negative for blurred vision and double vision.  Respiratory:  Negative for cough, hemoptysis, sputum production, shortness of breath and wheezing.   Cardiovascular:  Negative for chest pain, palpitations and leg swelling.  Gastrointestinal:  Negative for abdominal pain, constipation, diarrhea, heartburn, nausea and vomiting.  Genitourinary:  Negative for dysuria and urgency.  Musculoskeletal:  Negative for back pain, falls, joint pain, myalgias and neck pain.  Skin:  Negative for rash.  Neurological:  Negative for dizziness, tingling, tremors, weakness and headaches.  Endo/Heme/Allergies:  Does not bruise/bleed easily.  Psychiatric/Behavioral:  Negative for depression and suicidal ideas. The patient is not nervous/anxious and does not have insomnia.     Physical Exam: Estimated body mass index is 20.14 kg/m as calculated from the following:   Height as of 03/04/22: '5\' 5"'$  (1.651 m).   Weight as of 03/04/22: 121 lb (54.9 kg). There were no vitals taken for this visit. General Appearance: Well nourished, in no apparent distress.  Eyes: PERRLA, EOMs, conjunctiva no swelling or erythema, normal fundi and vessels.  Sinuses: No Frontal/maxillary tenderness  ENT/Mouth: Ext aud canals clear, normal light reflex with TMs without erythema, bulging. Good dentition. No erythema, swelling, or exudate on post pharynx. Tonsils not swollen or erythematous. Hearing normal.  Neck: Supple, thyroid normal. No bruits  Respiratory: Respiratory effort normal, BS equal bilaterally without rales, rhonchi, wheezing or stridor.  Cardio: RRR without murmurs, rubs or gallops. Brisk peripheral pulses without edema.  Chest:  symmetric, with normal excursions and percussion.  Breasts: Symmetric, without lumps, nipple discharge, retractions.  Abdomen: Soft, nontender, no guarding, rebound, hernias, masses, or organomegaly.  Lymphatics: Non tender without lymphadenopathy.  Genitourinary:  Musculoskeletal: Full ROM all peripheral extremities,5/5 strength, and normal gait. Right upper leg/hip slightly decreased strength Skin: Warm, dry without rashes, lesions, ecchymosis. Neuro: Cranial nerves intact, reflexes equal bilaterally. Normal muscle tone, no cerebellar symptoms. Sensation intact.  Psych: Awake and oriented X 3, normal affect, Insight and Judgment appropriate.    EKG: NSR no ST changes.   Magda Bernheim ANP-C  [facility]  06/03/2022

## 2022-06-05 ENCOUNTER — Encounter: Payer: 59 | Admitting: Nurse Practitioner

## 2022-06-05 DIAGNOSIS — M87051 Idiopathic aseptic necrosis of right femur: Secondary | ICD-10-CM

## 2022-06-05 DIAGNOSIS — Z136 Encounter for screening for cardiovascular disorders: Secondary | ICD-10-CM

## 2022-06-05 DIAGNOSIS — K824 Cholesterolosis of gallbladder: Secondary | ICD-10-CM

## 2022-06-05 DIAGNOSIS — Z1389 Encounter for screening for other disorder: Secondary | ICD-10-CM

## 2022-06-05 DIAGNOSIS — Z79899 Other long term (current) drug therapy: Secondary | ICD-10-CM

## 2022-06-05 DIAGNOSIS — Z0001 Encounter for general adult medical examination with abnormal findings: Secondary | ICD-10-CM

## 2022-06-05 DIAGNOSIS — I10 Essential (primary) hypertension: Secondary | ICD-10-CM

## 2022-06-05 DIAGNOSIS — E559 Vitamin D deficiency, unspecified: Secondary | ICD-10-CM

## 2022-06-05 DIAGNOSIS — R7309 Other abnormal glucose: Secondary | ICD-10-CM

## 2022-06-05 DIAGNOSIS — N951 Menopausal and female climacteric states: Secondary | ICD-10-CM

## 2022-06-05 DIAGNOSIS — Z1329 Encounter for screening for other suspected endocrine disorder: Secondary | ICD-10-CM

## 2022-06-05 DIAGNOSIS — E782 Mixed hyperlipidemia: Secondary | ICD-10-CM

## 2022-06-27 ENCOUNTER — Other Ambulatory Visit: Payer: Self-pay | Admitting: Internal Medicine

## 2022-07-16 ENCOUNTER — Other Ambulatory Visit: Payer: Self-pay | Admitting: Internal Medicine

## 2022-07-16 DIAGNOSIS — J4520 Mild intermittent asthma, uncomplicated: Secondary | ICD-10-CM

## 2022-07-16 DIAGNOSIS — J302 Other seasonal allergic rhinitis: Secondary | ICD-10-CM

## 2022-07-16 MED ORDER — MONTELUKAST SODIUM 10 MG PO TABS: ORAL_TABLET | ORAL | 3 refills | Status: DC

## 2022-07-23 ENCOUNTER — Other Ambulatory Visit: Payer: Self-pay | Admitting: Internal Medicine

## 2022-07-23 DIAGNOSIS — M87051 Idiopathic aseptic necrosis of right femur: Secondary | ICD-10-CM

## 2022-07-24 ENCOUNTER — Encounter: Payer: 59 | Admitting: Nurse Practitioner

## 2022-07-30 NOTE — Progress Notes (Addendum)
Complete Physical  Assessment and Plan:  Yvonne Bell was seen today for annual exam.  Diagnoses and all orders for this visit: Encounter for routine adult medical exam with abnormal findings Yearly Due for Mammogram Colonoscopy Pap  Aseptic Necrosis of Both Hips/Osteoarthritis of both hips, unspecified osteoarthritis type/Bursitis of both hips, unspecified bursa Both hips replaced in the past year Continue to follow with ortho Will begin to wean off Cymbalta 40mg  qd x 7, 20 mg qd x 7 then 20 mg QOD x 7 days then stop  Hypertension, essential Continue current medications: Losartan 25 mg QD Monitor blood pressure at home; call if consistently over 130/80 Continue DASH diet.   Reminder to go to the ER if any CP, SOB, nausea, dizziness, severe HA, changes vision/speech, left arm numbness and tingling and jaw pain. - CBC - CMP  LBBB Has been evaluated by cardiology with stress test  and echo normal  Abnormal glucose Continue diet and exercise A1c  Medication management Magnesium  Screening, ischemic heart disease EKG  Screening for AAA - U/S ABD Retroperitoneal LTD  Vitamin D deficiency Continue supplementation to maintain goal of 70-100 Taking Vitamin D  IU 15,000daily  vitamin D level  Gallbladder polyp U/S 03/27/22 stable to have repeat in 1 year Continue to monitor  Screening for thyroid disorder -     TSH  Iron deficiency anemia - Iron, Total and TIBC - Ferritin  Hyperlipidemia Continue diet and exercise -     Lipid panel  Hot Flashes Continue Veozah 45 mg QD, symptoms well controlled with medication  Screening for blood or protein in urine -     Urinalysis w reflex microscopic - Microalbumin/creatinine urine ratio  Discharge from left nipple -Mammogram  Colon Cancer Screening - Cologuard ordered    Discussed med's effects and SE's. Screening labs and tests as requested with regular follow-up as recommended. Over 40 minutes of interview, exam,  counseling, chart review, and complex, high level critical decision making was performed this visit.   HPI  57 y.o. 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  She had left hip replacement 09/2020 and right hip replacement 12/2020. Right does still have some mild aching. Good ROM in both. She does use lyrica for pain   She is due for U/S recheck of GB polyp 2016 referral to general surgery , that was stable in , 2014 originally diagnosed in 2012. She has had NEG GGT labs and denies abdominal symptoms.Repeat U/S 03/27/22 showed  Redemonstrated gallbladder polyp measuring up to 8 mm, similar to prior exam. Recommend follow-up right upper quadrant ultrasound in 12 months to reassess.  BMI is Body mass index is 20.53 kg/m., she has been working on diet and exercise. Currently on topiramate and phenergan without side effects Wt Readings from Last 3 Encounters:  08/03/22 123 lb 6.4 oz (56 kg)  03/04/22 121 lb (54.9 kg)  06/05/21 117 lb (53.1 kg)   DERM: Family history of melanoma  She has intermittent nipple discharge from left nipple which has been occurring for years.  Has not increased or changed color, no masses.  Her blood pressure has been controlled at home, today their BP is BP: 122/68  BP Readings from Last 3 Encounters:  08/03/22 122/68  03/04/22 128/62  06/05/21  110/62  She does not workout. She denies chest pain, shortness of breath, dizziness.   She is taking Losartan 25 mg  every evening and bisoprolol in the mornings. Denies any side effects from the medications. Had LBBB on previous EKG. Had previous cardiology consult, with stress test and echo normal   She is not on cholesterol medication and denies  myalgias. Her cholesterol is at goal. The cholesterol last visit was:   Lab Results  Component Value Date   CHOL 226 (H) 03/04/2022   HDL 91 03/04/2022   LDLCALC 116 (H) 03/04/2022   TRIG 88 03/04/2022   CHOLHDL 2.5 03/04/2022    She has not been working on diet and exercise for prediabetes, she is on bASA, she is on ACE/ARB and denies polydipsia, polyuria, visual disturbances, vomiting and weight loss. Last A1C in the office was:  Lab Results  Component Value Date   HGBA1C 5.0 06/05/2021    Last GFR: Lab Results  Component Value Date   EGFR 67 03/04/2022    Patient is on Vitamin D supplement.   Lab Results  Component Value Date   VD25OH 46 03/04/2022      Current Medications:  Current Outpatient Medications on File Prior to Visit  Medication Sig Dispense Refill   Cholecalciferol (VITAMIN D3) 125 MCG (5000 UT) CAPS Take 2 capsules  (10,000 units)  Daily 30 capsule 0   dexamethasone (DECADRON) 4 MG tablet Take 1 tab 3 x day - 3 days, then 2 x day - 3 days, then 1 tab daily (Patient not taking: Reported on 08/03/2022) 20 tablet 0   DULoxetine (CYMBALTA) 60 MG capsule TAKE 1 CAPSULE BY MOUTH DAILY FOR MOOD OR ANXIETY 90 capsule 3   fexofenadine (ALLEGRA) 180 MG tablet Take 1 tablet (180 mg total) by mouth daily. 90 tablet 3   losartan (COZAAR) 25 MG tablet TAKE 1 TABLET BY MOUTH DAILY FOR BLOOD PRESSURE 90 tablet 3   mupirocin ointment (BACTROBAN) 2 % Apply 3 to 4 x /day 22 g 3   phentermine (ADIPEX-P) 37.5 MG tablet TAKE 1/2 TO 1 TABLET BY MOUTH EVERY MORNING FOR DIETING AND WEIGHT LOSS 90 tablet 0   pregabalin (LYRICA) 100 MG capsule Take 1 capsule 3 x /day for Chronic Pain 270 capsule 1   topiramate (TOPAMAX) 50 MG tablet TAKE 1/2 TO 1 TABLET BY MOUTH TWICE DAILY AT SUPPERTIME AND BEDTIME FOR DIETING AND WEIGHT LOSS 180 tablet 1   triamcinolone ointment (KENALOG) 0.1 % Apply 1 Application topically 2 (two) times daily. 80 g 1   VEOZAH 45 MG TABS TAKE 1 TABLET BY MOUTH EVERY DAY  30 tablet 6   fluticasone (FLONASE) 50 MCG/ACT nasal spray Place 1 spray into both nostrils daily. (Patient not taking: Reported on 08/03/2022) 18.2 mL 2   montelukast (SINGULAIR) 10 MG tablet Take 1 tablet daily for Allergies & Asthma (Patient not taking: Reported on 08/03/2022) 90 tablet 3   No current facility-administered medications on file prior to visit.   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 Health Maintenance:   Immunization History  Administered Date(s) Administered   Influenza Inj Mdck Quad With Preservative 03/01/2017, 02/25/2018, 02/07/2019, 02/23/2020   Influenza,inj,Quad PF,6+ Mos 02/20/2021, 03/04/2022  PFIZER(Purple Top)SARS-COV-2 Vaccination 11/24/2019, 12/15/2019, 12/15/2019   PPD Test 06/08/2014   Tdap 05/29/2008, 12/08/2018   Health Maintenance  Topic Date Due   HIV Screening  Never done   Zoster Vaccines- Shingrix (1 of 2) Never done   COLONOSCOPY (Pts 45-13yrs Insurance coverage will need to be confirmed)  Never done   COVID-19 Vaccine (4 - 2023-24 season) 12/26/2021   MAMMOGRAM  11/15/2023   PAP SMEAR-Modifier  03/04/2025   DTaP/Tdap/Td (3 - Td or Tdap) 12/07/2028   Hepatitis C Screening  Completed   HPV VACCINES  Aged Out       Names of Other Physician/Practitioners you currently use: 1. [facility] here for primary care 2. Eye Exam: 2022 3. Dental Exam 2021, schedule for 2022   Patient Care Team: Lucky Cowboy, MD as PCP - General (Internal Medicine) [name], MD (Inactive) as Consulting Physician (Obstetrics and Gynecology)  Surgical History:  She has a past surgical history that includes Cesarean  section; Novasure ablation (2007); Wisdom tooth extraction; and Skin surgery (Left). Family History:  Herfamily history includes Aneurysm in her maternal grandmother; Breast cancer in her mother; Cancer in her maternal aunt and paternal uncle; Cancer (age of onset: 64) in her father; Colon polyps in her sister; Heart disease in her maternal grandfather; Hyperlipidemia in her maternal grandfather and mother; Hypertension in her mother; Osteopenia in her mother; Stroke in her maternal grandmother. Social History:  She reports that she quit smoking about 29 years ago. Her smoking use included cigarettes. She has never used smokeless tobacco. She reports current alcohol use. She reports that she does not use drugs.  Review of Systems: Review of Systems  Constitutional:  Negative for chills and fever.  HENT:  Negative for congestion, hearing loss, sinus pain, sore throat and tinnitus.   Eyes:  Negative for blurred vision and double vision.  Respiratory:  Negative for cough, hemoptysis, sputum production, shortness of breath and wheezing.   Cardiovascular:  Negative for chest pain, palpitations and leg swelling.  Gastrointestinal:  Negative for abdominal pain, constipation, diarrhea, heartburn, nausea and vomiting.  Genitourinary:  Negative for dysuria and urgency.       Left nipple clear discharge- has been present for years  Musculoskeletal:  Negative for back pain, falls, joint pain, myalgias and neck pain.  Skin:  Negative for rash.  Neurological:  Negative for dizziness, tingling, tremors, weakness and headaches.  Endo/Heme/Allergies:  Does not bruise/bleed easily.  Psychiatric/Behavioral:  Negative for depression and suicidal ideas. The patient is not nervous/anxious and does not have insomnia.     Physical Exam: Estimated body mass index is 20.53 kg/m as calculated from the following:   Height as of this encounter: 5\' 5"  (1.651 m).   Weight as of this encounter: 123 lb 6.4 oz (56 kg). BP  122/68   Pulse 69   Temp (!) 97.5 F (36.4 C)   Ht 5\' 5"  (1.651 m)   Wt 123 lb 6.4 oz (56 kg)   SpO2 99%   BMI 20.53 kg/m  General Appearance: Well nourished, in no apparent distress.  Eyes: PERRLA, EOMs, conjunctiva no swelling or erythema, normal fundi and vessels.  Sinuses: No Frontal/maxillary tenderness  ENT/Mouth: Ext aud canals clear, normal light reflex with TMs without erythema, bulging. Good dentition. No erythema, swelling, or exudate on post pharynx. Tonsils not swollen or erythematous. Hearing normal.  Neck: Supple, thyroid normal. No bruits  Respiratory: Respiratory effort normal, BS equal bilaterally without rales,  rhonchi, wheezing or stridor.  Cardio: RRR without murmurs, rubs or gallops. Brisk peripheral pulses without edema.  Chest: symmetric, with normal excursions and percussion.  Breasts: breasts appear normal, no suspicious masses, no skin or nipple changes or axillary nodes. Abdomen: Soft, nontender, no guarding, rebound, hernias, masses, or organomegaly.  Lymphatics: Non tender without lymphadenopathy.  Genitourinary: defer Musculoskeletal: Full ROM all peripheral extremities,5/5 strength, and normal gait.  Skin: Warm, dry without rashes, lesions, ecchymosis. Neuro: Cranial nerves intact, reflexes equal bilaterally. Normal muscle tone, no cerebellar symptoms. Sensation intact.  Psych: Awake and oriented X 3, normal affect, Insight and Judgment appropriate.    EKG: NSR, LBBB  AAA: < 3 cm  [facility]  08/03/2022

## 2022-08-03 ENCOUNTER — Encounter: Payer: Self-pay | Admitting: Nurse Practitioner

## 2022-08-03 ENCOUNTER — Ambulatory Visit (INDEPENDENT_AMBULATORY_CARE_PROVIDER_SITE_OTHER): Payer: 59 | Admitting: Nurse Practitioner

## 2022-08-03 VITALS — BP 122/68 | HR 69 | Temp 97.5°F | Ht 65.0 in | Wt 123.4 lb

## 2022-08-03 DIAGNOSIS — M87051 Idiopathic aseptic necrosis of right femur: Secondary | ICD-10-CM

## 2022-08-03 DIAGNOSIS — Z79899 Other long term (current) drug therapy: Secondary | ICD-10-CM

## 2022-08-03 DIAGNOSIS — Z0000 Encounter for general adult medical examination without abnormal findings: Secondary | ICD-10-CM | POA: Diagnosis not present

## 2022-08-03 DIAGNOSIS — I700 Atherosclerosis of aorta: Secondary | ICD-10-CM

## 2022-08-03 DIAGNOSIS — I10 Essential (primary) hypertension: Secondary | ICD-10-CM | POA: Diagnosis not present

## 2022-08-03 DIAGNOSIS — E782 Mixed hyperlipidemia: Secondary | ICD-10-CM

## 2022-08-03 DIAGNOSIS — Z1389 Encounter for screening for other disorder: Secondary | ICD-10-CM

## 2022-08-03 DIAGNOSIS — E559 Vitamin D deficiency, unspecified: Secondary | ICD-10-CM

## 2022-08-03 DIAGNOSIS — Z136 Encounter for screening for cardiovascular disorders: Secondary | ICD-10-CM | POA: Diagnosis not present

## 2022-08-03 DIAGNOSIS — K824 Cholesterolosis of gallbladder: Secondary | ICD-10-CM

## 2022-08-03 DIAGNOSIS — R7309 Other abnormal glucose: Secondary | ICD-10-CM

## 2022-08-03 DIAGNOSIS — N6452 Nipple discharge: Secondary | ICD-10-CM

## 2022-08-03 DIAGNOSIS — Z1211 Encounter for screening for malignant neoplasm of colon: Secondary | ICD-10-CM

## 2022-08-03 DIAGNOSIS — Z1329 Encounter for screening for other suspected endocrine disorder: Secondary | ICD-10-CM

## 2022-08-03 DIAGNOSIS — I447 Left bundle-branch block, unspecified: Secondary | ICD-10-CM

## 2022-08-03 DIAGNOSIS — Z0001 Encounter for general adult medical examination with abnormal findings: Secondary | ICD-10-CM

## 2022-08-03 DIAGNOSIS — D509 Iron deficiency anemia, unspecified: Secondary | ICD-10-CM

## 2022-08-03 NOTE — Patient Instructions (Signed)

## 2022-08-04 LAB — CBC WITH DIFFERENTIAL/PLATELET
Absolute Monocytes: 366 cells/uL (ref 200–950)
Basophils Absolute: 48 cells/uL (ref 0–200)
Basophils Relative: 0.8 %
Eosinophils Absolute: 192 cells/uL (ref 15–500)
Eosinophils Relative: 3.2 %
HCT: 40.3 % (ref 35.0–45.0)
Hemoglobin: 13.5 g/dL (ref 11.7–15.5)
Lymphs Abs: 1530 cells/uL (ref 850–3900)
MCH: 31.8 pg (ref 27.0–33.0)
MCHC: 33.5 g/dL (ref 32.0–36.0)
MCV: 95 fL (ref 80.0–100.0)
MPV: 10.7 fL (ref 7.5–12.5)
Monocytes Relative: 6.1 %
Neutro Abs: 3864 cells/uL (ref 1500–7800)
Neutrophils Relative %: 64.4 %
Platelets: 288 10*3/uL (ref 140–400)
RBC: 4.24 10*6/uL (ref 3.80–5.10)
RDW: 11.8 % (ref 11.0–15.0)
Total Lymphocyte: 25.5 %
WBC: 6 10*3/uL (ref 3.8–10.8)

## 2022-08-04 LAB — LIPID PANEL
Cholesterol: 216 mg/dL — ABNORMAL HIGH (ref <200)
HDL: 87 mg/dL (ref >=50)
LDL Cholesterol (Calc): 114 mg/dL (calc) — ABNORMAL HIGH
Non-HDL Cholesterol (Calc): 129 mg/dL (calc) (ref <130)
Total CHOL/HDL Ratio: 2.5 (calc) (ref <5.0)
Triglycerides: 66 mg/dL (ref <150)

## 2022-08-04 LAB — URINALYSIS, ROUTINE W REFLEX MICROSCOPIC
Bilirubin Urine: NEGATIVE
Glucose, UA: NEGATIVE
Hgb urine dipstick: NEGATIVE
Ketones, ur: NEGATIVE
Leukocytes,Ua: NEGATIVE
Nitrite: NEGATIVE
Protein, ur: NEGATIVE
Specific Gravity, Urine: 1.006 (ref 1.001–1.035)
pH: 5.5 (ref 5.0–8.0)

## 2022-08-04 LAB — COMPLETE METABOLIC PANEL WITH GFR
AG Ratio: 2.1 (calc) (ref 1.0–2.5)
ALT: 36 U/L — ABNORMAL HIGH (ref 6–29)
AST: 30 U/L (ref 10–35)
Albumin: 4.4 g/dL (ref 3.6–5.1)
Alkaline phosphatase (APISO): 87 U/L (ref 37–153)
BUN: 12 mg/dL (ref 7–25)
CO2: 30 mmol/L (ref 20–32)
Calcium: 9.7 mg/dL (ref 8.6–10.4)
Chloride: 107 mmol/L (ref 98–110)
Creat: 0.79 mg/dL (ref 0.50–1.03)
Globulin: 2.1 g/dL (calc) (ref 1.9–3.7)
Glucose, Bld: 91 mg/dL (ref 65–99)
Potassium: 4.5 mmol/L (ref 3.5–5.3)
Sodium: 145 mmol/L (ref 135–146)
Total Bilirubin: 0.4 mg/dL (ref 0.2–1.2)
Total Protein: 6.5 g/dL (ref 6.1–8.1)
eGFR: 87 mL/min/{1.73_m2} (ref >=60)

## 2022-08-04 LAB — TSH: TSH: 1.07 mIU/L (ref 0.40–4.50)

## 2022-08-04 LAB — MAGNESIUM: Magnesium: 2.3 mg/dL (ref 1.5–2.5)

## 2022-08-04 LAB — HEMOGLOBIN A1C
Hgb A1c MFr Bld: 5.3 % of total Hgb (ref <5.7)
Mean Plasma Glucose: 105 mg/dL
eAG (mmol/L): 5.8 mmol/L

## 2022-08-04 LAB — IRON, TOTAL/TOTAL IRON BINDING CAP
%SAT: 42 % (calc) (ref 16–45)
Iron: 124 ug/dL (ref 45–160)
TIBC: 298 mcg/dL (calc) (ref 250–450)

## 2022-08-04 LAB — VITAMIN D 25 HYDROXY (VIT D DEFICIENCY, FRACTURES): Vit D, 25-Hydroxy: 39 ng/mL (ref 30–100)

## 2022-08-04 LAB — MICROALBUMIN / CREATININE URINE RATIO
Creatinine, Urine: 18 mg/dL — ABNORMAL LOW (ref 20–275)
Microalb, Ur: <0.2 mg/dL

## 2022-08-04 LAB — FERRITIN: Ferritin: 66 ng/mL (ref 16–232)

## 2022-09-29 ENCOUNTER — Other Ambulatory Visit: Payer: Self-pay | Admitting: Internal Medicine

## 2022-09-29 MED ORDER — PSEUDOEPHEDRINE HCL ER 120 MG PO TB12: ORAL_TABLET | ORAL | 3 refills | Status: DC

## 2022-09-29 MED ORDER — AZITHROMYCIN 250 MG PO TABS: ORAL_TABLET | ORAL | 1 refills | Status: DC

## 2022-09-29 MED ORDER — DEXAMETHASONE 4 MG PO TABS: ORAL_TABLET | ORAL | 0 refills | Status: DC

## 2022-10-04 ENCOUNTER — Other Ambulatory Visit: Payer: Self-pay | Admitting: Nurse Practitioner

## 2022-10-08 ENCOUNTER — Other Ambulatory Visit: Payer: Self-pay | Admitting: Internal Medicine

## 2022-11-23 ENCOUNTER — Other Ambulatory Visit: Payer: Self-pay | Admitting: Nurse Practitioner

## 2022-12-01 ENCOUNTER — Other Ambulatory Visit: Payer: Self-pay | Admitting: Nurse Practitioner

## 2022-12-01 ENCOUNTER — Encounter: Payer: Self-pay | Admitting: Internal Medicine

## 2022-12-01 DIAGNOSIS — Z1231 Encounter for screening mammogram for malignant neoplasm of breast: Secondary | ICD-10-CM

## 2022-12-01 DIAGNOSIS — N6452 Nipple discharge: Secondary | ICD-10-CM

## 2022-12-01 NOTE — Addendum Note (Signed)
Addended by: Anda Kraft E on: 12/01/2022 08:41 AM   Modules accepted: Orders

## 2022-12-16 ENCOUNTER — Ambulatory Visit
Admission: RE | Admit: 2022-12-16 | Discharge: 2022-12-16 | Disposition: A | Discharge status: Home / Self Care | Payer: 59 | Source: Ambulatory Visit | Attending: Nurse Practitioner | Admitting: Nurse Practitioner

## 2022-12-16 DIAGNOSIS — N6452 Nipple discharge: Secondary | ICD-10-CM

## 2023-02-01 NOTE — Progress Notes (Unsigned)
6 month follow up  Assessment and Plan:   Aseptic Necrosis of Both Hips/Osteoarthritis of both hips, unspecified osteoarthritis type/Bursitis of both hips, unspecified bursa Both hips replaced in the past year Continue to follow with ortho Will begin to wean off Cymbalta 40mg  qd x 7, 20 mg qd x 7 then 20 mg QOD x 7 days then stop  Hypertension, essential Continue current medications: Losartan 25 mg QD Monitor blood pressure at home; call if consistently over 130/80 Continue DASH diet.   Reminder to go to the ER if any CP, SOB, nausea, dizziness, severe HA, changes vision/speech, left arm numbness and tingling and jaw pain. - CBC - CMP  LBBB Has been evaluated by cardiology with stress test  and echo normal  Abnormal glucose Continue diet and exercise - CBC - CMP  Medication management Magnesium   Vitamin D deficiency Continue supplementation to maintain goal of 70-100 Taking Vitamin D  IU 15,000daily   Hyperlipidemia Continue diet and exercise -     Lipid panel  Hot Flashes Continue Veozah 45 mg QD, symptoms well controlled with medication      Discussed med's effects and SE's. Screening labs and tests as requested with regular follow-up as recommended. Over 40 minutes of interview, exam, counseling, chart review, and complex, high level critical decision making was performed this visit.   HPI  57 y.o. female  presents for a complete physical and follow up for has Osteopenia; Allergic rhinitis; Vitamin D deficiency; Gall bladder polyp; Shingles Left T4 Dermatome 07/02/2020; Essential hypertension; Aseptic necrosis of bone of both hips (HCC); Hot flashes due to menopause; and Hyperlipidemia, mixed on their problem list..  She had left hip replacement 09/2020 and right hip replacement 12/2020. Right does still have some mild aching. Good ROM in both. She does use lyrica for pain   She is due for U/S recheck of GB polyp 2016 referral to general surgery , that was  stable in , 2014 originally diagnosed in 2012. She has had NEG GGT labs and denies abdominal symptoms.Repeat U/S 03/27/22 showed  Redemonstrated gallbladder polyp measuring up to 8 mm, similar to prior exam. Recommend follow-up right upper quadrant ultrasound in 12 months to reassess.  BMI is There is no height or weight on file to calculate BMI., she has been working on diet and exercise. Currently on topiramate and phenergan without side effects Wt Readings from Last 3 Encounters:  08/03/22 123 lb 6.4 oz (56 kg)  03/04/22 121 lb (54.9 kg)  06/05/21 117 lb (53.1 kg)   DERM: Family history of melanoma  She has intermittent nipple discharge from left nipple which has been occurring for years.  Has not increased or changed color, no masses.  Her blood pressure has been controlled at home, today their BP is    BP Readings from Last 3 Encounters:  08/03/22 122/68  03/04/22 128/62  06/05/21 110/62  She does not workout. She denies chest pain, shortness of breath, dizziness.   She is taking Losartan 25 mg  every evening and bisoprolol in the mornings. Denies any side effects from the medications. Had LBBB on previous EKG. Had previous cardiology consult, with stress test and echo normal   She is not on cholesterol medication and denies myalgias. Her cholesterol is at goal. The cholesterol last visit was:   Lab Results  Component Value Date   CHOL 216 (H) 08/03/2022   HDL 87 08/03/2022   LDLCALC 114 (H) 08/03/2022   TRIG 66 08/03/2022  CHOLHDL 2.5 08/03/2022    She has not been working on diet and exercise for prediabetes, she is on bASA, she is on ACE/ARB and denies polydipsia, polyuria, visual disturbances, vomiting and weight loss. Last A1C in the office was:  Lab Results  Component Value Date   HGBA1C 5.3 08/03/2022    Last GFR: Lab Results  Component Value Date   EGFR 87 08/03/2022    Patient is on Vitamin D supplement.   Lab Results  Component Value Date   VD25OH 39  08/03/2022      Current Medications:  Current Outpatient Medications on File Prior to Visit  Medication Sig Dispense Refill   azithromycin (ZITHROMAX) 250 MG tablet Take 2 tablets with Food on  Day 1, then 1 tablet Daily with Food for Sinusitis / Bronchitis 6 each 1   Cholecalciferol (VITAMIN D3) 125 MCG (5000 UT) CAPS Take 2 capsules  (10,000 units)  Daily 30 capsule 0   dexamethasone (DECADRON) 4 MG tablet Take 1 tab 3 x day - 3 days, then 2 x day - 3 days, then 1 tab daily 20 tablet 0   DULoxetine (CYMBALTA) 60 MG capsule TAKE 1 CAPSULE BY MOUTH DAILY FOR MOOD OR ANXIETY 90 capsule 3   fexofenadine (ALLEGRA) 180 MG tablet Take 1 tablet (180 mg total) by mouth daily. 90 tablet 3   fluticasone (FLONASE) 50 MCG/ACT nasal spray Place 1 spray into both nostrils daily. (Patient not taking: Reported on 08/03/2022) 18.2 mL 2   losartan (COZAAR) 25 MG tablet TAKE 1 TABLET BY MOUTH DAILY FOR BLOOD PRESSURE 90 tablet 3   montelukast (SINGULAIR) 10 MG tablet Take 1 tablet daily for Allergies & Asthma (Patient not taking: Reported on 08/03/2022) 90 tablet 3   mupirocin ointment (BACTROBAN) 2 % Apply 3 to 4 x /day 22 g 3   phentermine (ADIPEX-P) 37.5 MG tablet TAKE 1/2 TO 1 TABLET EVERY MORNING FOR DIETING AND WEIGHT LOSS 90 tablet 1   pregabalin (LYRICA) 100 MG capsule Take 1 capsule 3 x /day for Chronic Pain 270 capsule 1   pseudoephedrine (SUDAFED) 120 MG 12 hr tablet Take  1 tablet  2 x /day (every 12 hours)  for Sinus & Chest Congestion 60 tablet 3   topiramate (TOPAMAX) 50 MG tablet TAKE 1/2 TO 1 TABLET BY MOUTH TWICE DAILY AT SUPPERTIME AND BEDTIME FOR DIETING AND WEIGHT LOSS 180 tablet 1   triamcinolone ointment (KENALOG) 0.1 % Apply 1 Application topically 2 (two) times daily. 80 g 1   VEOZAH 45 MG TABS TAKE 1 TABLET BY MOUTH EVERY DAY 30 tablet 6   No current facility-administered medications on file prior to visit.   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 Health Maintenance:   Immunization History  Administered Date(s) Administered   Influenza Inj Mdck Quad With Preservative 03/01/2017, 02/25/2018, 02/07/2019, 02/23/2020   Influenza,inj,Quad PF,6+ Mos 02/20/2021, 03/04/2022   PFIZER(Purple Top)SARS-COV-2 Vaccination 11/24/2019, 12/15/2019, 12/15/2019   PPD Test 06/08/2014   Tdap 05/29/2008, 12/08/2018   Zoster Recombinant(Shingrix) 11/07/2022, 01/10/2023   Health Maintenance  Topic Date Due   HIV Screening  Never done   Colonoscopy  Never done   COVID-19 Vaccine (4 - 2023-24 season) 12/27/2022  MAMMOGRAM  12/15/2024   Cervical Cancer Screening (HPV/Pap Cotest)  03/05/2027   DTaP/Tdap/Td (3 - Td or Tdap) 12/07/2028   Hepatitis C Screening  Completed   Zoster Vaccines- Shingrix  Completed   HPV VACCINES  Aged Out       Names of Other Physician/Practitioners you currently use: 1. [facility] here for primary care 2. Eye Exam: 2022 3. Dental Exam 2021, schedule for 2022   Patient Care Team: Lucky Cowboy, MD as PCP - General (Internal Medicine) [name], MD (Inactive) as Consulting Physician (Obstetrics and Gynecology)  Surgical History:  She has a past surgical history that includes Cesarean section; Novasure ablation (2007); Wisdom tooth extraction; and Skin surgery (Left). Family History:  Herfamily history includes Aneurysm in her maternal grandmother; Breast cancer in her mother; Cancer in her maternal aunt and paternal uncle; Cancer (age of onset: 21) in her father; Colon polyps in her sister; Heart disease in her maternal grandfather; Hyperlipidemia in her maternal grandfather and mother; Hypertension in  her mother; Osteopenia in her mother; Stroke in her maternal grandmother. Social History:  She reports that she quit smoking about 30 years ago. Her smoking use included cigarettes. She has never used smokeless tobacco. She reports current alcohol use. She reports that she does not use drugs.  Review of Systems: Review of Systems  Constitutional:  Negative for chills and fever.  HENT:  Negative for congestion, hearing loss, sinus pain, sore throat and tinnitus.   Eyes:  Negative for blurred vision and double vision.  Respiratory:  Negative for cough, hemoptysis, sputum production, shortness of breath and wheezing.   Cardiovascular:  Negative for chest pain, palpitations and leg swelling.  Gastrointestinal:  Negative for abdominal pain, constipation, diarrhea, heartburn, nausea and vomiting.  Genitourinary:  Negative for dysuria and urgency.       Left nipple clear discharge- has been present for years  Musculoskeletal:  Negative for back pain, falls, joint pain, myalgias and neck pain.  Skin:  Negative for rash.  Neurological:  Negative for dizziness, tingling, tremors, weakness and headaches.  Endo/Heme/Allergies:  Does not bruise/bleed easily.  Psychiatric/Behavioral:  Negative for depression and suicidal ideas. The patient is not nervous/anxious and does not have insomnia.     Physical Exam: Estimated body mass index is 20.53 kg/m as calculated from the following:   Height as of 08/03/22: 5\' 5"  (1.651 m).   Weight as of 08/03/22: 123 lb 6.4 oz (56 kg). There were no vitals taken for this visit. General Appearance: Well nourished, in no apparent distress.  Eyes: PERRLA, EOMs, conjunctiva no swelling or erythema, normal fundi and vessels.  Sinuses: No Frontal/maxillary tenderness  ENT/Mouth: Ext aud canals clear, normal light reflex with TMs without erythema, bulging. Good dentition. No erythema, swelling, or exudate on post pharynx. Tonsils not swollen or erythematous. Hearing normal.   Neck: Supple, thyroid normal. No bruits  Respiratory: Respiratory effort normal, BS equal bilaterally without rales, rhonchi, wheezing or stridor.  Cardio: RRR without murmurs, rubs or gallops. Brisk peripheral pulses without edema.  Chest: symmetric, with normal excursions and percussion.  Breasts: breasts appear normal, no suspicious masses, no skin or nipple changes or axillary nodes. Abdomen: Soft, nontender, no guarding, rebound, hernias, masses, or organomegaly.  Lymphatics: Non tender without lymphadenopathy.  Genitourinary: defer Musculoskeletal: Full ROM all peripheral extremities,5/5 strength, and normal gait.  Skin: Warm, dry without rashes, lesions, ecchymosis. Neuro: Cranial nerves intact, reflexes equal bilaterally. Normal muscle tone, no cerebellar symptoms.  Sensation intact.  Psych: Awake and oriented X 3, normal affect, Insight and Judgment appropriate.    EKG: NSR, LBBB  AAA: < 3 cm  Revonda Humphrey [facility]  02/01/2023

## 2023-02-02 ENCOUNTER — Ambulatory Visit (INDEPENDENT_AMBULATORY_CARE_PROVIDER_SITE_OTHER): Payer: 59 | Admitting: Nurse Practitioner

## 2023-02-02 ENCOUNTER — Telehealth: Payer: Self-pay

## 2023-02-02 ENCOUNTER — Encounter: Payer: Self-pay | Admitting: Nurse Practitioner

## 2023-02-02 VITALS — BP 120/64 | HR 75 | Temp 97.5°F | Ht 65.0 in | Wt 122.6 lb

## 2023-02-02 DIAGNOSIS — Z79899 Other long term (current) drug therapy: Secondary | ICD-10-CM

## 2023-02-02 DIAGNOSIS — I10 Essential (primary) hypertension: Secondary | ICD-10-CM

## 2023-02-02 DIAGNOSIS — K5900 Constipation, unspecified: Secondary | ICD-10-CM

## 2023-02-02 DIAGNOSIS — E782 Mixed hyperlipidemia: Secondary | ICD-10-CM

## 2023-02-02 DIAGNOSIS — N951 Menopausal and female climacteric states: Secondary | ICD-10-CM

## 2023-02-02 DIAGNOSIS — M87051 Idiopathic aseptic necrosis of right femur: Secondary | ICD-10-CM

## 2023-02-02 DIAGNOSIS — M87052 Idiopathic aseptic necrosis of left femur: Secondary | ICD-10-CM

## 2023-02-02 DIAGNOSIS — E559 Vitamin D deficiency, unspecified: Secondary | ICD-10-CM

## 2023-02-02 DIAGNOSIS — R7309 Other abnormal glucose: Secondary | ICD-10-CM

## 2023-02-02 DIAGNOSIS — Z1211 Encounter for screening for malignant neoplasm of colon: Secondary | ICD-10-CM

## 2023-02-02 MED ORDER — LINACLOTIDE 72 MCG PO CAPS: 72.0000 ug | ORAL_CAPSULE | Freq: Every day | ORAL | 2 refills | Status: DC

## 2023-02-02 MED ORDER — LINACLOTIDE 290 MCG PO CAPS
290.0000 ug | ORAL_CAPSULE | Freq: Every day | ORAL | 3 refills | Status: DC
Start: 2023-02-02 — End: 2023-02-02

## 2023-02-02 NOTE — Telephone Encounter (Signed)
Prior auth for [employer] completed and submitted.

## 2023-02-02 NOTE — Patient Instructions (Signed)

## 2023-02-02 NOTE — Telephone Encounter (Signed)
Prior auth approved. Patient aware.

## 2023-02-03 ENCOUNTER — Other Ambulatory Visit: Payer: Self-pay | Admitting: Nurse Practitioner

## 2023-02-03 DIAGNOSIS — N289 Disorder of kidney and ureter, unspecified: Secondary | ICD-10-CM

## 2023-02-03 LAB — CBC WITH DIFFERENTIAL/PLATELET
Absolute Monocytes: 506 {cells}/uL (ref 200–950)
Basophils Absolute: 58 {cells}/uL (ref 0–200)
Basophils Relative: 0.9 %
Eosinophils Absolute: 173 {cells}/uL (ref 15–500)
Eosinophils Relative: 2.7 %
HCT: 35.5 % (ref 35.0–45.0)
Hemoglobin: 11.8 g/dL (ref 11.7–15.5)
Lymphs Abs: 1786 {cells}/uL (ref 850–3900)
MCH: 31.8 pg (ref 27.0–33.0)
MCHC: 33.2 g/dL (ref 32.0–36.0)
MCV: 95.7 fL (ref 80.0–100.0)
MPV: 10.6 fL (ref 7.5–12.5)
Monocytes Relative: 7.9 %
Neutro Abs: 3878 {cells}/uL (ref 1500–7800)
Neutrophils Relative %: 60.6 %
Platelets: 322 10*3/uL (ref 140–400)
RBC: 3.71 10*6/uL — ABNORMAL LOW (ref 3.80–5.10)
RDW: 12 % (ref 11.0–15.0)
Total Lymphocyte: 27.9 %
WBC: 6.4 10*3/uL (ref 3.8–10.8)

## 2023-02-03 LAB — LIPID PANEL
Cholesterol: 237 mg/dL — ABNORMAL HIGH (ref <200)
HDL: 86 mg/dL (ref >=50)
LDL Cholesterol (Calc): 131 mg/dL — ABNORMAL HIGH
Non-HDL Cholesterol (Calc): 151 mg/dL — ABNORMAL HIGH (ref <130)
Total CHOL/HDL Ratio: 2.8 (calc) (ref <5.0)
Triglycerides: 94 mg/dL (ref <150)

## 2023-02-03 LAB — COMPLETE METABOLIC PANEL WITH GFR
AG Ratio: 2.4 (calc) (ref 1.0–2.5)
ALT: 27 U/L (ref 6–29)
AST: 20 U/L (ref 10–35)
Albumin: 4.8 g/dL (ref 3.6–5.1)
Alkaline phosphatase (APISO): 84 U/L (ref 37–153)
BUN/Creatinine Ratio: 11 (calc) (ref 6–22)
BUN: 15 mg/dL (ref 7–25)
CO2: 27 mmol/L (ref 20–32)
Calcium: 9.9 mg/dL (ref 8.6–10.4)
Chloride: 105 mmol/L (ref 98–110)
Creat: 1.32 mg/dL — ABNORMAL HIGH (ref 0.50–1.03)
Globulin: 2 g/dL (ref 1.9–3.7)
Glucose, Bld: 81 mg/dL (ref 65–99)
Potassium: 3.9 mmol/L (ref 3.5–5.3)
Sodium: 141 mmol/L (ref 135–146)
Total Bilirubin: 0.4 mg/dL (ref 0.2–1.2)
Total Protein: 6.8 g/dL (ref 6.1–8.1)
eGFR: 47 mL/min/{1.73_m2} — ABNORMAL LOW (ref >=60)

## 2023-02-03 LAB — MAGNESIUM: Magnesium: 2.3 mg/dL (ref 1.5–2.5)

## 2023-02-08 ENCOUNTER — Encounter: Payer: Self-pay | Admitting: Nurse Practitioner

## 2023-02-08 ENCOUNTER — Ambulatory Visit (INDEPENDENT_AMBULATORY_CARE_PROVIDER_SITE_OTHER): Payer: 59 | Admitting: Nurse Practitioner

## 2023-02-08 VITALS — BP 120/68 | HR 62 | Temp 97.5°F | Ht 65.0 in | Wt 122.0 lb

## 2023-02-08 DIAGNOSIS — N1831 Chronic kidney disease, stage 3a: Secondary | ICD-10-CM

## 2023-02-08 DIAGNOSIS — K5900 Constipation, unspecified: Secondary | ICD-10-CM

## 2023-02-08 NOTE — Progress Notes (Signed)
Assessment and Plan:  Yvonne Bell was seen today for follow-up.  Diagnoses and all orders for this visit:  CKD stage 3a, GFR 45-59 ml/min (HCC) Increase fluids, avoid NSAIDS, monitor sugars, will monitor  -     BASIC METABOLIC PANEL WITH GFR  Constipation, unspecified constipation type Continue to push fluids Continue Linzess Keep GI appointment 02/15/23 With Quentin Mulling PA-C If develop fever, abdominal pain go to ER      Further disposition pending results of labs. Discussed med's effects and SE's.   Over 30 minutes of exam, counseling, chart review, and critical decision making was performed.   Future Appointments  Date Time Provider Department Center  02/09/2023  1:45 PM Raynelle Dick, NP [facility] None  02/15/2023 11:00 AM Doree Albee, PA-C [facility]  08/03/2023  9:00 AM Raynelle Dick, NP [facility] None    ------------------------------------------------------------------------------------------------------------------   HPI BP 120/68   Pulse 62   Temp (!) 97.5 F (36.4 C)   Ht 5\' 5"  (1.651 m)   Wt 122 lb (55.3 kg)   SpO2 97%   BMI 20.30 kg/m  57 y.o.female presents for  Had visit 02/02/23:Constipation started last week and had been passing mucus and blood. Did use magnesium citrate and had large amount of liquid BM and has not had BM since(4 days). Normally has BM daily. Exam showed no abdominal distention, pain on palpation or masses. She was started on Linzess 290 mcg daily, advised to push fluids and referred to GI.  She has GI appt scheduled 02/15/23. States since visit 6 days ago she moved her bowels with watery and solid stool 02/05/23 after Miralax, linzess and enema and 02/06/23 solid and watery.  No BM yesterday or today.   BP well controlled with Losartan 25 mg every day  BP Readings from Last 3 Encounters:  02/08/23 120/68  02/02/23 120/64  08/03/22 122/68  Denies headaches, chest pain, shortness of breath and dizziness   BMI is  Body mass index is 20.3 kg/m., she has been working on diet and exercise. Wt Readings from Last 3 Encounters:  02/08/23 122 lb (55.3 kg)  02/02/23 122 lb 9.6 oz (55.6 kg)  08/03/22 123 lb 6.4 oz (56 kg)     Past Medical History:  Diagnosis Date   Allergic rhinitis    Hyperlipidemia    Osteopenia    Vitamin D deficiency      Allergies  Allergen Reactions   Influenza Vaccines     Current Outpatient Medications on File Prior to Visit  Medication Sig   Cholecalciferol (VITAMIN D3) 125 MCG (5000 UT) CAPS Take 2 capsules  (10,000 units)  Daily   Docusate Calcium (STOOL SOFTENER PO) Take by mouth.   DULoxetine (CYMBALTA) 60 MG capsule TAKE 1 CAPSULE BY MOUTH DAILY FOR MOOD OR ANXIETY   linaclotide (LINZESS) 72 MCG capsule Take 1 capsule (72 mcg total) by mouth daily before breakfast.   losartan (COZAAR) 25 MG tablet TAKE 1 TABLET BY MOUTH DAILY FOR BLOOD PRESSURE   montelukast (SINGULAIR) 10 MG tablet Take 1 tablet daily for Allergies & Asthma   mupirocin ointment (BACTROBAN) 2 % Apply 3 to 4 x /day   phentermine (ADIPEX-P) 37.5 MG tablet TAKE 1/2 TO 1 TABLET EVERY MORNING FOR DIETING AND WEIGHT LOSS   pregabalin (LYRICA) 100 MG capsule Take 1 capsule 3 x /day for Chronic Pain   topiramate (TOPAMAX) 50 MG tablet TAKE 1/2 TO 1 TABLET BY MOUTH TWICE DAILY AT SUPPERTIME AND BEDTIME FOR DIETING  AND WEIGHT LOSS   VEOZAH 45 MG TABS TAKE 1 TABLET BY MOUTH EVERY DAY   Vitamin D-Vitamin K (D3 + K2 PO) Take by mouth. 800IU vit d3, K2 180 mcg   No current facility-administered medications on file prior to visit.    ROS: all negative except above.   Physical Exam:  BP 120/68   Pulse 62   Temp (!) 97.5 F (36.4 C)   Ht 5\' 5"  (1.651 m)   Wt 122 lb (55.3 kg)   SpO2 97%   BMI 20.30 kg/m   General Appearance: Well nourished, in no apparent distress. Eyes: PERRLA, EOMs, conjunctiva no swelling or erythema  Neck: Supple, thyroid normal.  Respiratory: Respiratory effort normal, BS equal  bilaterally without rales, rhonchi, wheezing or stridor.  Cardio: RRR with no MRGs. Brisk peripheral pulses without edema.  Abdomen: Soft, + BS.  Non tender, no guarding, rebound, hernias, masses. Lymphatics: Non tender without lymphadenopathy.  Musculoskeletal: Full ROM, 5/5 strength, normal gait.  Skin: Warm, dry without rashes, lesions, ecchymosis.  Neuro: Cranial nerves intact. Normal muscle tone, no cerebellar symptoms. Sensation intact.  Psych: Awake and oriented X 3, normal affect, Insight and Judgment appropriate.     Raynelle Dick, NP 10:22 AM [facility]

## 2023-02-08 NOTE — Patient Instructions (Signed)

## 2023-02-09 ENCOUNTER — Ambulatory Visit: Payer: 59 | Admitting: Nurse Practitioner

## 2023-02-09 ENCOUNTER — Other Ambulatory Visit: Payer: Self-pay | Admitting: Internal Medicine

## 2023-02-09 ENCOUNTER — Other Ambulatory Visit: Payer: Self-pay | Admitting: Nurse Practitioner

## 2023-02-09 DIAGNOSIS — N1831 Chronic kidney disease, stage 3a: Secondary | ICD-10-CM

## 2023-02-09 DIAGNOSIS — K56600 Partial intestinal obstruction, unspecified as to cause: Secondary | ICD-10-CM

## 2023-02-09 LAB — BASIC METABOLIC PANEL WITH GFR
BUN/Creatinine Ratio: 11 (calc) (ref 6–22)
BUN: 14 mg/dL (ref 7–25)
CO2: 27 mmol/L (ref 20–32)
Calcium: 9.5 mg/dL (ref 8.6–10.4)
Chloride: 103 mmol/L (ref 98–110)
Creat: 1.23 mg/dL — ABNORMAL HIGH (ref 0.50–1.03)
Glucose, Bld: 80 mg/dL (ref 65–99)
Potassium: 4.6 mmol/L (ref 3.5–5.3)
Sodium: 140 mmol/L (ref 135–146)
eGFR: 51 mL/min/{1.73_m2} — ABNORMAL LOW (ref >=60)

## 2023-02-09 NOTE — Progress Notes (Signed)
   Patient c/o acute on chronic constipation & no BM x 4 days  with low midline abdominal pain.  Took #5 doses of Miralax & Fleets enema & Mag Citrate 5 days ago to have a BM of partially formed stool.  On Linzess 290 since.  No noted blood.  Recent labs showed              -  Creat up from    0.79   6 mo ago,      1.32 1 week ago      and       1.23 yesterday             -  GFR down from    87  6 mo ago,        47    1 week ago     and        51 yesterday  Suspect for preRenal Azotemia from Dehydration   Patient tearful  & c/o low midline abd pain   Abd Exam finds abd soft w/o palpable masses , guarding   or  tenderness  Patient was scheduled for renal ultrasound   Will also add order for Abd/Pelvic CAT scan

## 2023-02-10 ENCOUNTER — Other Ambulatory Visit: Payer: 59

## 2023-02-10 ENCOUNTER — Ambulatory Visit (HOSPITAL_COMMUNITY)
Admission: RE | Admit: 2023-02-10 | Discharge: 2023-02-10 | Disposition: A | Discharge status: Home / Self Care | Payer: 59 | Source: Ambulatory Visit | Attending: Internal Medicine | Admitting: Internal Medicine

## 2023-02-10 DIAGNOSIS — K56600 Partial intestinal obstruction, unspecified as to cause: Secondary | ICD-10-CM | POA: Diagnosis present

## 2023-02-10 MED ORDER — IOHEXOL 9 MG/ML PO SOLN
1000.0000 mL | Freq: Once | ORAL | Status: AC
  Administered 2023-02-10: 1000 mL via ORAL

## 2023-02-10 MED ORDER — IOHEXOL 300 MG/ML  SOLN
100.0000 mL | Freq: Once | INTRAMUSCULAR | Status: AC | PRN
  Administered 2023-02-10: 100 mL via INTRAVENOUS

## 2023-02-10 NOTE — Progress Notes (Signed)
<>*<>*<>*<>*<>*<>*<>*<>*<>*<>*<>*<>*<>*<>*<>*<>*<>*<>*<>*<>*<>*<>*<>*<>*<> <>*<>*<>*<>*<>*<>*<>*<>*<>*<>*<>*<>*<>*<>*<>*<>*<>*<>*<>*<>*<>*<>*<>*<>*<>  -   CT report is [place]  !    - No "bad" surprises  - All internal organs  Normal & OK   - Small gallstone of no consequence   - Just full of Poop - from " Stem to [place]ia Maxon "   <>0<>0<>0<>0<>0<>0<>0<>0<>0<>0<>0<>0<>0<>0<>0<>0<>0<>0<>0<>0<>0<>0<>0   - 9:45 pm - Discussed bowel regimen with patient   - Increase dietary fiber,   drink more fluids &   Take Senna laxatives as the most natural GI propulsive agent  <>*<>*<>*<>*<>*<>*<>*<>*<>*<>*<>*<>*<>*<>*<>*<>*<>*<>*<>*<>*<>*<>*<>*<>*<> <>*<>*<>*<>*<>*<>*<>*<>*<>*<>*<>*<>*<>*<>*<>*<>*<>*<>*<>*<>*<>*<>*<>*<>*<>

## 2023-02-15 ENCOUNTER — Telehealth: Payer: Self-pay

## 2023-02-15 ENCOUNTER — Encounter: Payer: Self-pay | Admitting: Physician Assistant

## 2023-02-15 ENCOUNTER — Ambulatory Visit (INDEPENDENT_AMBULATORY_CARE_PROVIDER_SITE_OTHER): Payer: 59 | Admitting: Physician Assistant

## 2023-02-15 VITALS — BP 118/70 | HR 65 | Ht 65.0 in | Wt 122.0 lb

## 2023-02-15 DIAGNOSIS — K625 Hemorrhage of anus and rectum: Secondary | ICD-10-CM | POA: Diagnosis not present

## 2023-02-15 DIAGNOSIS — K602 Anal fissure, unspecified: Secondary | ICD-10-CM

## 2023-02-15 DIAGNOSIS — K5904 Chronic idiopathic constipation: Secondary | ICD-10-CM

## 2023-02-15 MED ORDER — AMBULATORY NON FORMULARY MEDICATION: 1 refills | Status: DC

## 2023-02-15 MED ORDER — NA SULFATE-K SULFATE-MG SULF 17.5-3.13-1.6 GM/177ML PO SOLN: 1.0000 | Freq: Once | ORAL | 0 refills | Status: AC

## 2023-02-15 NOTE — Patient Instructions (Signed)
Your provider has prescribed diltiazem gel for you. Please follow the directions written on your prescription bottle or given to you specifically by your provider. Since this is a specialty medication and is not readily available at most local pharmacies, we have sent your prescription to:  [facility] information is below: Address: [address] [place], [place] [address]  [phone]  *Please DO NOT go directly from our office to pick up this medication! Give the pharmacy 1 day to process the prescription as this is compounded and takes time to make.   You have been scheduled for a colonoscopy. Please follow written instructions given to you at your visit today.   Please pick up your prep supplies at the pharmacy within the next 1-3 days.  If you use inhalers (even only as needed), please bring them with you on the day of your procedure.  DO NOT TAKE 7 DAYS PRIOR TO TEST- Trulicity (dulaglutide) Ozempic, Wegovy (semaglutide) Mounjaro (tirzepatide) Bydureon Bcise (exanatide extended release)  DO NOT TAKE 1 DAY PRIOR TO YOUR TEST Rybelsus (semaglutide) Adlyxin (lixisenatide) Victoza (liraglutide) Byetta (exanatide) ___________________________________________________________________________   Yvonne Bell  *IBS-C patients may begin to experience relief from belly pain and overall abdominal symptoms (pain, discomfort, and bloating) in about 1 week,  with symptoms typically improving over 12 weeks.  Take at least 30 minutes before the first meal of the day on an empty stomach You can have a loose stool if you eat a high-fat breakfast. Give it at least 7 days, may have more bowel movements during that time.   The diarrhea should go away and you should start having normal, complete, full bowel movements.  It may be helpful to start treatment when you can be near the comfort of your own bathroom, such as a weekend.  After you are out we can send in a prescription if  you did well, there is a prescription card    Toileting tips to help with your constipation - Drink at least 64-80 ounces of water/liquid per day. - Establish a time to try to move your bowels every day.  For many people, this is after a cup of coffee or after a meal such as breakfast. - Sit all of the way back on the toilet keeping your back fairly straight and while sitting up, try to rest the tops of your forearms on your upper thighs.   - Raising your feet with a step stool/squatty potty can be helpful to improve the angle that allows your stool to pass through the rectum. - Relax the rectum feeling it bulge toward the toilet water.  If you feel your rectum raising toward your body, you are contracting rather than relaxing. - Breathe in and slowly exhale. "Belly breath" by expanding your belly towards your belly button. Keep belly expanded as you gently direct pressure down and back to the anus.  A low pitched GRRR sound can assist with increasing intra-abdominal pressure.  (Can also trying to blow on a pinwheel and make it move, this helps with the same belly breathing) - Repeat 3-4 times. If unsuccessful, contract the pelvic floor to restore normal tone and get off the toilet.  Avoid excessive straining. - To reduce excessive wiping by teaching your anus to normally contract, place hands on outer aspect of knees and resist knee movement outward.  Hold 5-10 second then place hands just inside of knees and resist inward movement of knees.  Hold 5 seconds.  Repeat a few times  each way.  Go to the ER if unable to pass gas, severe AB pain, unable to hold down food, any shortness of breath of chest pain.

## 2023-02-15 NOTE — Progress Notes (Signed)
02/15/2023 Yvonne Bell [id number] 11/23/1965  Referring provider: Lucky Cowboy, MD Primary GI doctor: Dr. Lavon Paganini  ASSESSMENT AND PLAN:   Chronic Constipation Longstanding history of constipation, recently worsened. Recent changes in diet and medication (weight loss shots) may have contributed though this was over a year ago. Bright red blood per rectum and mucus in stool, possibly due to anal fissure from straining. No systemic symptoms. CT unremarkable recently other than stool burden -Schedule colonoscopy on 02/22/2023 due to symptoms and family history of colon cancer at [facility] with Dr. Lavon Paganini. - We have discussed the risks of bleeding, infection, perforation, medication reactions, and remote risk of death associated with colonoscopy. All questions were answered and the patient acknowledges these risk and wishes to proceed. -Continue Linzess daily. -Prescribe medication for anal fissure. -Encourage high fiber diet and adequate hydration.  Anal Fissure Likely secondary to chronic constipation and straining. Bright red blood per rectum and discomfort reported. -Prescribe medication for anal fissure. -Continue Linzess daily for constipation.   Patient Care Team: Lucky Cowboy, MD as PCP - General (Internal Medicine) [name], MD (Inactive) as Consulting Physician (Obstetrics and Gynecology)  HISTORY OF PRESENT ILLNESS: 57 y.o. female with a past medical history of hyperlipidemia, osteopenia, vitamin D deficiency and others listed below presents for evaluation of constipation.   02/10/2023 CT abdomen pelvis with contrast for suspected bowel obstruction constipation showed small gallstone normal liver, unremarkable pancreas subcentimeter hypodensities in the spleen likely benign large volume stool throughout the colon no bowel wall thickening otherwise unremarkable  Discussed the use of AI scribe software for clinical note transcription with the  patient, who gave verbal consent to proceed.  The patient, with a history of chronic constipation, presents with recent changes in bowel habits. She reports a lifelong history of constipation, which was exacerbated by weight loss injections in the past but has been off for the last year. Recently, she noticed an improvement in bowel movements, which she attributes to dietary changes, including daily apple consumption but she had acute constipation over the last month, uncertain the cause. She may have had less nuts, started on probiotic, denies new medications or supplements other than vitamin K.   The patient also reports rectal bleeding, described as bright red blood on the toilet paper, and the presence of mucus in her stool. She denies any associated pain, fever, chills, or significant unintentional weight loss. She has been taking Linzess for about a week, which resulted in a satisfactory bowel movement a few days ago. She also reports using enemas and magnesium citrate without significant relief.  The patient has a family history of colon cancer, with her maternal aunt passing away from the disease two years ago. She has never had a colonoscopy or any other colorectal cancer screening tests.      She  reports that she quit smoking about 30 years ago. Her smoking use included cigarettes. She has never used smokeless tobacco. She reports that she does not currently use alcohol. She reports that she does not use drugs.  RELEVANT LABS AND IMAGING:   CBC    Component Value Date/Time   WBC 6.4 02/02/2023 1208   RBC 3.71 (L) 02/02/2023 1208   HGB 11.8 02/02/2023 1208   HCT 35.5 02/02/2023 1208   PLT 322 02/02/2023 1208   MCV 95.7 02/02/2023 1208   MCH 31.8 02/02/2023 1208   MCHC 33.2 02/02/2023 1208   RDW 12.0 02/02/2023 1208   LYMPHSABS 1,786 02/02/2023  1208   MONOABS 0.7 06/08/2014 1144   EOSABS 173 02/02/2023 1208   BASOSABS 58 02/02/2023 1208   Recent Labs    03/04/22 0925  08/03/22 0935 02/02/23 1208  HGB 15.3 13.5 11.8    CMP     Component Value Date/Time   NA 140 02/08/2023 1049   K 4.6 02/08/2023 1049   CL 103 02/08/2023 1049   CO2 27 02/08/2023 1049   GLUCOSE 80 02/08/2023 1049   BUN 14 02/08/2023 1049   CREATININE 1.23 (H) 02/08/2023 1049   CALCIUM 9.5 02/08/2023 1049   PROT 6.8 02/02/2023 1208   ALBUMIN 4.4 09/05/2014 1024   AST 20 02/02/2023 1208   ALT 27 02/02/2023 1208   ALKPHOS 59 09/05/2014 1024   BILITOT 0.4 02/02/2023 1208   GFRNONAA 91 10/09/2020 1020   GFRAA 105 10/09/2020 1020      Latest Ref Rng & Units 02/02/2023   12:08 PM 08/03/2022    9:35 AM 03/04/2022    9:25 AM  Hepatic Function  Total Protein 6.1 - 8.1 g/dL 6.8  6.5  7.0   AST 10 - 35 U/L 20  30  28    ALT 6 - 29 U/L 27  36  36   Total Bilirubin 0.2 - 1.2 mg/dL 0.4  0.4  0.3       Current Medications:   Current Outpatient Medications (Endocrine & Metabolic):    VEOZAH 45 MG TABS, TAKE 1 TABLET BY MOUTH EVERY DAY  Current Outpatient Medications (Cardiovascular):    losartan (COZAAR) 25 MG tablet, TAKE 1 TABLET BY MOUTH DAILY FOR BLOOD PRESSURE  Current Outpatient Medications (Respiratory):    montelukast (SINGULAIR) 10 MG tablet, Take 1 tablet daily for Allergies & Asthma    Current Outpatient Medications (Other):    Cholecalciferol (VITAMIN D3) 125 MCG (5000 UT) CAPS, Take 2 capsules  (10,000 units)  Daily   DULoxetine (CYMBALTA) 60 MG capsule, TAKE 1 CAPSULE BY MOUTH DAILY FOR MOOD OR ANXIETY   LINZESS 290 MCG CAPS capsule, Take 290 mcg by mouth daily.   mupirocin ointment (BACTROBAN) 2 %, Apply 3 to 4 x /day   pregabalin (LYRICA) 100 MG capsule, Take 1 capsule 3 x /day for Chronic Pain   senna-docusate (SENOKOT-S) 8.6-50 MG tablet, Take 1 tablet by mouth daily.   topiramate (TOPAMAX) 50 MG tablet, TAKE 1/2 TO 1 TABLET BY MOUTH TWICE DAILY AT SUPPERTIME AND BEDTIME FOR DIETING AND WEIGHT LOSS   Vitamin D-Vitamin K (D3 + K2 PO), Take by mouth. 800IU vit  d3, K2 180 mcg   azithromycin (ZITHROMAX) 250 MG tablet, Take by mouth. (Patient not taking: Reported on 02/15/2023)  Medical History:  Past Medical History:  Diagnosis Date   Allergic rhinitis    Hyperlipidemia    Osteopenia    Vitamin D deficiency    Allergies:  Allergies  Allergen Reactions   Influenza Vaccines      Surgical History:  She  has a past surgical history that includes Cesarean section; Novasure ablation (2007); Wisdom tooth extraction; and Skin surgery (Left). Family History:  Her family history includes Aneurysm in her maternal grandmother; Breast cancer in her mother; Cancer in her maternal aunt and paternal uncle; Cancer (age of onset: 37) in her father; Colon polyps in her sister; Heart disease in her maternal grandfather; Hyperlipidemia in her maternal grandfather and mother; Hypertension in her mother; Osteopenia in her mother; Stroke in her maternal grandmother.  REVIEW OF SYSTEMS  : All other systems reviewed  and negative except where noted in the History of Present Illness.  PHYSICAL EXAM: BP 118/70   Pulse 65   Ht 5\' 5"  (1.651 m)   Wt 122 lb (55.3 kg)   BMI 20.30 kg/m  General Appearance: Well nourished, in no apparent distress. Head:   Normocephalic and atraumatic. Eyes:  sclerae anicteric,conjunctive pink  Respiratory: Respiratory effort normal, BS equal bilaterally without rales, rhonchi, wheezing. Cardio: RRR with no MRGs. Peripheral pulses intact.  Abdomen: Soft,  Non-distended ,active bowel sounds. No tenderness . No masses. Rectal: superior fissure noted, normal rectal tone, no internal hemorrhoids appreciated, no masses, non tender, scant stool, hemoccult positive Musculoskeletal: Full ROM, Normal gait. Without edema. Skin:  Dry and intact without significant lesions or rashes Neuro: Alert and  oriented x4;  No focal deficits. Psych:  Cooperative. Normal mood and affect.    Doree Albee, PA-C 11:17 AM

## 2023-02-15 NOTE — Telephone Encounter (Signed)
[employer] prior auth completed, submitted, and has been approved through 02/15/24.

## 2023-02-17 ENCOUNTER — Other Ambulatory Visit: Payer: 59

## 2023-02-22 ENCOUNTER — Encounter: Payer: 59 | Admitting: Gastroenterology

## 2023-02-22 ENCOUNTER — Telehealth: Payer: Self-pay | Admitting: Gastroenterology

## 2023-02-22 DIAGNOSIS — K625 Hemorrhage of anus and rectum: Secondary | ICD-10-CM

## 2023-02-22 MED ORDER — ONDANSETRON HCL 4 MG PO TABS: 4.0000 mg | ORAL_TABLET | Freq: Three times a day (TID) | ORAL | 0 refills | Status: DC | PRN

## 2023-02-22 MED ORDER — SUTAB 1479-225-188 MG PO TABS: 12.0000 | ORAL_TABLET | Freq: Once | ORAL | 0 refills | Status: AC

## 2023-02-22 NOTE — Telephone Encounter (Signed)
PT has a colonoscopy scheduled today at 3pm with Dr. Lavon Paganini and cannot keep her prep down. She has been throwing up since 8am. She is very concerned and needs to know what should she do at this point. Please advise.

## 2023-02-22 NOTE — Telephone Encounter (Signed)
Spoke with pt, she reports that she drank first dose of prep at 6pm yesterday and began vomiting at 8pm. States she vomits every time she tries to drink anything even vomited after trying to drink Gatorade this morning. Unable to tolerate Suprep. Reports she had a BM this morning that was dark liquid. MD made aware and it was decided to cancel procedure for today due to inadequate bowel preparation r/t vomiting with prep. Per MD recommendations sent prescription for Zofran and Sutab to pt's pharmacy. Instructed pt that she could take Zofran Q8hr PRN today for nausea but to save at least 2 tablets to take prior to each dose of prep. New instructions mailed to pt as she does not have MyChart and stated she was not interested in signing up. Instructed pt to call back with any questions. Pt agreeable to plan of care.

## 2023-02-23 ENCOUNTER — Other Ambulatory Visit: Payer: Self-pay | Admitting: Internal Medicine

## 2023-02-23 DIAGNOSIS — M76899 Other specified enthesopathies of unspecified lower limb, excluding foot: Secondary | ICD-10-CM

## 2023-02-23 MED ORDER — PREGABALIN 100 MG PO CAPS: ORAL_CAPSULE | ORAL | 1 refills | Status: DC

## 2023-03-02 ENCOUNTER — Ambulatory Visit (INDEPENDENT_AMBULATORY_CARE_PROVIDER_SITE_OTHER): Payer: 59

## 2023-03-02 VITALS — Temp 97.9°F

## 2023-03-02 DIAGNOSIS — Z23 Encounter for immunization: Secondary | ICD-10-CM | POA: Diagnosis not present

## 2023-03-06 ENCOUNTER — Encounter: Payer: Self-pay | Admitting: Certified Registered Nurse Anesthetist

## 2023-03-09 ENCOUNTER — Ambulatory Visit (AMBULATORY_SURGERY_CENTER): Payer: 59 | Admitting: Gastroenterology

## 2023-03-09 ENCOUNTER — Encounter: Payer: Self-pay | Admitting: Gastroenterology

## 2023-03-09 VITALS — BP 107/61 | HR 66 | Temp 98.0°F | Resp 12 | Ht 65.0 in | Wt 122.0 lb

## 2023-03-09 DIAGNOSIS — K5904 Chronic idiopathic constipation: Secondary | ICD-10-CM | POA: Diagnosis present

## 2023-03-09 DIAGNOSIS — R194 Change in bowel habit: Secondary | ICD-10-CM

## 2023-03-09 DIAGNOSIS — Z538 Procedure and treatment not carried out for other reasons: Secondary | ICD-10-CM

## 2023-03-09 MED ORDER — SODIUM CHLORIDE 0.9 % IV SOLN: 500.0000 mL | Freq: Once | INTRAVENOUS | Status: DC

## 2023-03-09 MED ORDER — SUTAB 1479-225-188 MG PO TABS: 24.0000 | ORAL_TABLET | Freq: Once | ORAL | 0 refills | Status: AC

## 2023-03-09 NOTE — Progress Notes (Unsigned)
Report given to PACU, vss 

## 2023-03-09 NOTE — Op Note (Addendum)
[facility] Patient Name: Yvonne Bell Procedure Date: 03/09/2023 2:39 PM MRN: [id number] Endoscopist: Napoleon Form , MD, [id number] Age: 57 Referring MD:  Date of Birth: [birth year] Gender: Female Account #: [id number] Procedure:                Colonoscopy Indications:              Change in bowel habits, recent worsening                            constipation. Never had colonoscopy for colorectal                            cancer screening Medicines:                Monitored Anesthesia Care Procedure:                Pre-Anesthesia Assessment:                           - Prior to the procedure, a History and Physical                            was performed, and patient medications and                            allergies were reviewed. The patient's tolerance of                            previous anesthesia was also reviewed. The risks                            and benefits of the procedure and the sedation                            options and risks were discussed with the patient.                            All questions were answered, and informed consent                            was obtained. Prior Anticoagulants: The patient has                            taken no anticoagulant or antiplatelet agents. ASA                            Grade Assessment: II - A patient with mild systemic                            disease. After reviewing the risks and benefits,                            the patient was deemed in satisfactory condition to  undergo the procedure.                           After obtaining informed consent, the colonoscope                            was passed under direct vision. Throughout the                            procedure, the patient's blood pressure, pulse, and                            oxygen saturations were monitored continuously. The                            Olympus Scope [id number] was introduced  through the                            anus with the intention of advancing to the cecum.                            The scope was advanced to the rectum before the                            procedure was aborted. Medications were given. The                            colonoscopy was performed with difficulty due to                            poor bowel prep with stool present. The patient                            tolerated the procedure well. The quality of the                            bowel preparation was poor. The rectum was                            photographed. Scope In: 2:47:20 PM Scope Out: 2:48:07 PM Total Procedure Duration: 0 hours 0 minutes 47 seconds  Findings:                 The perianal and digital rectal examinations were                            normal.                           A large amount of stool was found in the rectum,                            precluding visualization. Complications:            No immediate complications. Estimated Blood Loss:  Estimated blood loss was minimal. Impression:               - Preparation of the colon was poor.                           - Stool in the rectum.                           - No specimens collected. Recommendation:           - Patient has a contact number available for                            emergencies. The signs and symptoms of potential                            delayed complications were discussed with the                            patient. Return to normal activities tomorrow.                            Written discharge instructions were provided to the                            patient.                           - Resume previous diet.                           - Continue present medications.                           - Repeat colonoscopy at the next available                            appointment because the bowel preparation was                            suboptimal. Napoleon Form,  MD 03/09/2023 2:54:49 PM This report has been signed electronically.

## 2023-03-09 NOTE — Patient Instructions (Signed)
Please read handouts provided. Continue present medications. Colonoscopy scheduled 03/10/2023.  YOU HAD AN ENDOSCOPIC PROCEDURE TODAY AT THE [facility]:   Refer to the procedure report that was given to you for any specific questions about what was found during the examination.  If the procedure report does not answer your questions, please call your gastroenterologist to clarify.  If you requested that your care partner not be given the details of your procedure findings, then the procedure report has been included in a sealed envelope for you to review at your convenience later.  YOU SHOULD EXPECT: Some feelings of bloating in the abdomen. Passage of more gas than usual.  Walking can help get rid of the air that was put into your GI tract during the procedure and reduce the bloating. If you had a lower endoscopy (such as a colonoscopy or flexible sigmoidoscopy) you may notice spotting of blood in your stool or on the toilet paper. If you underwent a bowel prep for your procedure, you may not have a normal bowel movement for a few days.  Please Note:  You might notice some irritation and congestion in your nose or some drainage.  This is from the oxygen used during your procedure.  There is no need for concern and it should clear up in a day or so.  SYMPTOMS TO REPORT IMMEDIATELY:  Following lower endoscopy (colonoscopy or flexible sigmoidoscopy):  Excessive amounts of blood in the stool  Significant tenderness or worsening of abdominal pains  Swelling of the abdomen that is new, acute  Fever of 100F or higher.   For urgent or emergent issues, a gastroenterologist can be reached at any hour by calling [phone]. Do not use MyChart messaging for urgent concerns.    DIET:   Drink plenty of fluids but you should avoid alcoholic beverages for 24 hours.  ACTIVITY:  You should plan to take it easy for the rest of today and you should NOT DRIVE or use heavy machinery until  tomorrow (because of the sedation medicines used during the test).    FOLLOW UP: Our staff will call the number listed on your records the next business day following your procedure.  We will call around 7:15- 8:00 am to check on you and address any questions or concerns that you may have regarding the information given to you following your procedure. If we do not reach you, we will leave a message.     If any biopsies were taken you will be contacted by phone or by letter within the next 1-3 weeks.  Please call us at [phone] if you have not heard about the biopsies in 3 weeks.    SIGNATURES/CONFIDENTIALITY: You and/or your care partner have signed paperwork which will be entered into your electronic medical record.  These signatures attest to the fact that that the information above on your After Visit Summary has been reviewed and is understood.  Full responsibility of the confidentiality of this discharge information lies with you and/or your care-partner.

## 2023-03-09 NOTE — Progress Notes (Unsigned)
[facility] History and Physical   Primary Care Physician:  Lucky Cowboy, MD   Reason for Procedure:  Constipation.Family history of colon cancer in maternal aunt  Plan:     colonoscopy with possible interventions as needed     HPI: Yvonne Bell is a very pleasant 57 y.o. female here for colonoscopy for recent change in bowel habits with worsening constipation and abdominal discomfort. CT abd & pelvis negative for bowel obstruction, showed increased stool burden. Maternal aunt with colon cancer. The risks and benefits as well as alternatives of endoscopic procedure(s) have been discussed and reviewed. All questions answered. The patient agrees to proceed.    Past Medical History:  Diagnosis Date   Allergic rhinitis    Hyperlipidemia    Osteopenia    Vitamin D deficiency     Past Surgical History:  Procedure Laterality Date   CESAREAN SECTION     NOVASURE ABLATION  2007   SKIN SURGERY Left    Moderate atypia, abdomen   WISDOM TOOTH EXTRACTION      Prior to Admission medications   Medication Sig Start Date End Date Taking? Authorizing Provider  AMBULATORY NON FORMULARY MEDICATION Medication Name: Diltiazem 2%/Lidocaine 2%   Using your index finger apply a small amount of medication inside the anal opening and to the external anal area twice daily x 6 weeks. 02/15/23  Yes Quentin Mulling R, PA-C  DULoxetine (CYMBALTA) 60 MG capsule TAKE 1 CAPSULE BY MOUTH DAILY FOR MOOD OR ANXIETY 07/23/22  Yes Raynelle Dick, NP  LINZESS 290 MCG CAPS capsule Take 290 mcg by mouth daily. 02/03/23  Yes [provider]  losartan (COZAAR) 25 MG tablet TAKE 1 TABLET BY MOUTH DAILY FOR BLOOD PRESSURE 11/23/22  Yes Raynelle Dick, NP  montelukast (SINGULAIR) 10 MG tablet Take 1 tablet daily for Allergies & Asthma 07/16/22  Yes Lucky Cowboy, MD  ondansetron (ZOFRAN) 4 MG tablet Take 1 tablet (4 mg total) by mouth every 8 (eight) hours as needed for nausea or vomiting  (Also take one tablet 30 minutes-1 hour prior to each colonoscopy prep). 02/22/23  Yes Napoleon Form, MD  pregabalin (LYRICA) 100 MG capsule Take 1 capsule 3 x /day for Chronic Pain 02/23/23  Yes Lucky Cowboy, MD  senna-docusate (SENOKOT-S) 8.6-50 MG tablet Take 1 tablet by mouth daily.   Yes [provider]  VEOZAH 45 MG TABS TAKE 1 TABLET BY MOUTH EVERY DAY 10/05/22  Yes Raynelle Dick, NP  Cholecalciferol (VITAMIN D3) 125 MCG (5000 UT) CAPS Take 2 capsules  (10,000 units)  Daily 09/27/20   Lucky Cowboy, MD  mupirocin ointment (BACTROBAN) 2 % Apply 3 to 4 x /day 11/20/21   Lucky Cowboy, MD  topiramate (TOPAMAX) 50 MG tablet TAKE 1/2 TO 1 TABLET BY MOUTH TWICE DAILY AT SUPPERTIME AND BEDTIME FOR DIETING AND WEIGHT LOSS 05/01/22   Raynelle Dick, NP  Vitamin D-Vitamin K (D3 + K2 PO) Take by mouth. 800IU vit d3, K2 180 mcg    [provider]    Current Outpatient Medications  Medication Sig Dispense Refill   AMBULATORY NON FORMULARY MEDICATION Medication Name: Diltiazem 2%/Lidocaine 2%   Using your index finger apply a small amount of medication inside the anal opening and to the external anal area twice daily x 6 weeks. 30 g 1   DULoxetine (CYMBALTA) 60 MG capsule TAKE 1 CAPSULE BY MOUTH DAILY FOR MOOD OR ANXIETY 90 capsule 3   LINZESS 290 MCG CAPS capsule  Take 290 mcg by mouth daily.     losartan (COZAAR) 25 MG tablet TAKE 1 TABLET BY MOUTH DAILY FOR BLOOD PRESSURE 90 tablet 3   montelukast (SINGULAIR) 10 MG tablet Take 1 tablet daily for Allergies & Asthma 90 tablet 3   ondansetron (ZOFRAN) 4 MG tablet Take 1 tablet (4 mg total) by mouth every 8 (eight) hours as needed for nausea or vomiting (Also take one tablet 30 minutes-1 hour prior to each colonoscopy prep). 10 tablet 0   pregabalin (LYRICA) 100 MG capsule Take 1 capsule 3 x /day for Chronic Pain 270 capsule 1   senna-docusate (SENOKOT-S) 8.6-50 MG tablet Take 1 tablet by mouth daily.     VEOZAH 45 MG  TABS TAKE 1 TABLET BY MOUTH EVERY DAY 30 tablet 6   Cholecalciferol (VITAMIN D3) 125 MCG (5000 UT) CAPS Take 2 capsules  (10,000 units)  Daily 30 capsule 0   mupirocin ointment (BACTROBAN) 2 % Apply 3 to 4 x /day 22 g 3   topiramate (TOPAMAX) 50 MG tablet TAKE 1/2 TO 1 TABLET BY MOUTH TWICE DAILY AT SUPPERTIME AND BEDTIME FOR DIETING AND WEIGHT LOSS 180 tablet 1   Vitamin D-Vitamin K (D3 + K2 PO) Take by mouth. 800IU vit d3, K2 180 mcg     Current Facility-Administered Medications  Medication Dose Route Frequency Provider Last Rate Last Admin   0.9 %  sodium chloride infusion  500 mL Intravenous Once Napoleon Form, MD        Allergies as of 03/09/2023   (No Active Allergies)    Family History  Problem Relation Age of Onset   Hyperlipidemia Mother    Hypertension Mother    Osteopenia Mother    Breast cancer Mother    Cancer Father 16       Melanoma with lung METS   Colon polyps Sister    Cancer Maternal Aunt        colon   Cancer Paternal Uncle        lung   Stroke Maternal Grandmother    Aneurysm Maternal Grandmother    Hyperlipidemia Maternal Grandfather    Heart disease Maternal Grandfather    Esophageal cancer Neg Hx    Stomach cancer Neg Hx    Colon cancer Neg Hx    Rectal cancer Neg Hx     Social History   Socioeconomic History   Marital status: Married    Spouse name: Not on file   Number of children: 2   Years of education: Not on file   Highest education level: Not on file  Occupational History   Occupation: receptionist  Tobacco Use   Smoking status: Former    Current packs/day: 0.00    Types: Cigarettes    Quit date: 11/06/1992    Years since quitting: 30.3   Smokeless tobacco: Never  Vaping Use   Vaping status: Never Used  Substance and Sexual Activity   Alcohol use: Not Currently   Drug use: No   Sexual activity: Yes    Birth control/protection: Surgical  Other Topics Concern   Not on file  Social History Narrative   Not on file    Social Determinants of Health   Financial Resource Strain: Not on file  Food Insecurity: Not on file  Transportation Needs: Not on file  Physical Activity: Not on file  Stress: Not on file  Social Connections: Not on file  Intimate Partner Violence: Not on file    Review of Systems:  All other review of systems negative except as mentioned in the HPI.  Physical Exam: Vital signs in last 24 hours: BP 128/71   Pulse 80   Temp 98 F (36.7 C)   Resp (!) 21   Ht 5\' 5"  (1.651 m)   Wt 122 lb (55.3 kg)   SpO2 95%   BMI 20.30 kg/m  General:   Alert, NAD Lungs:  Clear .   Heart:  Regular rate and rhythm Abdomen:  Soft, nontender and nondistended. Neuro/Psych:  Alert and cooperative. Normal mood and affect. A and O x 3  Reviewed labs, radiology imaging, old records and pertinent past GI work up  Patient is appropriate for planned procedure(s) and anesthesia in an ambulatory setting   K. Scherry Ran , MD [phone]

## 2023-03-10 ENCOUNTER — Telehealth: Payer: Self-pay

## 2023-03-10 ENCOUNTER — Encounter: Payer: Self-pay | Admitting: Gastroenterology

## 2023-03-10 ENCOUNTER — Ambulatory Visit (AMBULATORY_SURGERY_CENTER): Payer: 59 | Admitting: Gastroenterology

## 2023-03-10 VITALS — BP 105/61 | HR 64 | Temp 97.5°F | Resp 14 | Ht 65.0 in | Wt 122.0 lb

## 2023-03-10 DIAGNOSIS — K5904 Chronic idiopathic constipation: Secondary | ICD-10-CM | POA: Diagnosis present

## 2023-03-10 MED ORDER — SODIUM CHLORIDE 0.9 % IV SOLN: 500.0000 mL | Freq: Once | INTRAVENOUS | Status: DC

## 2023-03-10 NOTE — Patient Instructions (Signed)

## 2023-03-10 NOTE — Telephone Encounter (Signed)
Post call not completed.  Procedure aborted yesterday due to bad prep.  Pt returning today for procedure

## 2023-03-10 NOTE — Progress Notes (Signed)
Report to PACU, RN, vss, BBS= Clear.  

## 2023-03-10 NOTE — Progress Notes (Signed)
Pt's states no medical or surgical changes since previsit or office visit. 

## 2023-03-10 NOTE — Progress Notes (Signed)
GASTROENTEROLOGY PROCEDURE H&P NOTE   Primary Care Physician: Lucky Cowboy, MD    Reason for Procedure:  Constipation, change in bowel habits, family history of colon cancer, hematochezia  Plan:    Colonoscopy  Patient is appropriate for endoscopic procedure(s) in the ambulatory ([facility]) setting.  The nature of the procedure, as well as the risks, benefits, and alternatives were carefully and thoroughly reviewed with the patient. Ample time for discussion and questions allowed. The patient understood, was satisfied, and agreed to proceed.     HPI: Yvonne Bell is a 57 y.o. female who presents for colonoscopy for evaluation of change in bowel habits, constipation, hematochezia.  Family history notable for maternal aunt with colon cancer.  Recent CT abdomen/pelvis negative for bowel obstruction, showed increase stool burden.  Attempted colonoscopy yesterday with Dr. Lavon Paganini which was notable for large amount of stool precluding visualization.  She continued clear liquids yesterday and completed additional bowel prep last evening and returns today for repeat attempt at colonoscopy.    Please see outpatient notes from 02/15/2023 for additional details.  Past Medical History:  Diagnosis Date   Allergic rhinitis    Hyperlipidemia    Osteopenia    Vitamin D deficiency     Past Surgical History:  Procedure Laterality Date   CESAREAN SECTION     COLONOSCOPY     NOVASURE ABLATION  04/27/2005   SKIN SURGERY Left    Moderate atypia, abdomen   WISDOM TOOTH EXTRACTION      Prior to Admission medications   Medication Sig Start Date End Date Taking? Authorizing Provider  DULoxetine (CYMBALTA) 60 MG capsule TAKE 1 CAPSULE BY MOUTH DAILY FOR MOOD OR ANXIETY 07/23/22  Yes Raynelle Dick, NP  LINZESS 290 MCG CAPS capsule Take 290 mcg by mouth daily. 02/03/23  Yes [provider]  losartan (COZAAR) 25 MG tablet TAKE 1 TABLET BY MOUTH DAILY FOR BLOOD PRESSURE 11/23/22  Yes  Raynelle Dick, NP  montelukast (SINGULAIR) 10 MG tablet Take 1 tablet daily for Allergies & Asthma 07/16/22  Yes Lucky Cowboy, MD  pregabalin (LYRICA) 100 MG capsule Take 1 capsule 3 x /day for Chronic Pain 02/23/23  Yes Lucky Cowboy, MD  senna-docusate (SENOKOT-S) 8.6-50 MG tablet Take 1 tablet by mouth daily.   Yes [provider]  VEOZAH 45 MG TABS TAKE 1 TABLET BY MOUTH EVERY DAY 10/05/22  Yes Raynelle Dick, NP  Vitamin D-Vitamin K (D3 + K2 PO) Take by mouth. 800IU vit d3, K2 180 mcg   Yes [provider]  AMBULATORY NON FORMULARY MEDICATION Medication Name: Diltiazem 2%/Lidocaine 2%   Using your index finger apply a small amount of medication inside the anal opening and to the external anal area twice daily x 6 weeks. 02/15/23   Doree Albee, PA-C  Cholecalciferol (VITAMIN D3) 125 MCG (5000 UT) CAPS Take 2 capsules  (10,000 units)  Daily 09/27/20   Lucky Cowboy, MD  mupirocin ointment (BACTROBAN) 2 % Apply 3 to 4 x /day 11/20/21   Lucky Cowboy, MD  topiramate (TOPAMAX) 50 MG tablet TAKE 1/2 TO 1 TABLET BY MOUTH TWICE DAILY AT SUPPERTIME AND BEDTIME FOR DIETING AND WEIGHT LOSS 05/01/22   Raynelle Dick, NP    Current Outpatient Medications  Medication Sig Dispense Refill   DULoxetine (CYMBALTA) 60 MG capsule TAKE 1 CAPSULE BY MOUTH DAILY FOR MOOD OR ANXIETY 90 capsule 3   LINZESS 290 MCG CAPS capsule Take 290 mcg by mouth daily.  losartan (COZAAR) 25 MG tablet TAKE 1 TABLET BY MOUTH DAILY FOR BLOOD PRESSURE 90 tablet 3   montelukast (SINGULAIR) 10 MG tablet Take 1 tablet daily for Allergies & Asthma 90 tablet 3   pregabalin (LYRICA) 100 MG capsule Take 1 capsule 3 x /day for Chronic Pain 270 capsule 1   senna-docusate (SENOKOT-S) 8.6-50 MG tablet Take 1 tablet by mouth daily.     VEOZAH 45 MG TABS TAKE 1 TABLET BY MOUTH EVERY DAY 30 tablet 6   Vitamin D-Vitamin K (D3 + K2 PO) Take by mouth. 800IU vit d3, K2 180 mcg     AMBULATORY NON FORMULARY  MEDICATION Medication Name: Diltiazem 2%/Lidocaine 2%   Using your index finger apply a small amount of medication inside the anal opening and to the external anal area twice daily x 6 weeks. 30 g 1   Cholecalciferol (VITAMIN D3) 125 MCG (5000 UT) CAPS Take 2 capsules  (10,000 units)  Daily 30 capsule 0   mupirocin ointment (BACTROBAN) 2 % Apply 3 to 4 x /day 22 g 3   topiramate (TOPAMAX) 50 MG tablet TAKE 1/2 TO 1 TABLET BY MOUTH TWICE DAILY AT SUPPERTIME AND BEDTIME FOR DIETING AND WEIGHT LOSS 180 tablet 1   Current Facility-Administered Medications  Medication Dose Route Frequency Provider Last Rate Last Admin   0.9 %  sodium chloride infusion  500 mL Intravenous Once [name], DO        Allergies as of 03/10/2023   (No Known Allergies)    Family History  Problem Relation Age of Onset   Hyperlipidemia Mother    Hypertension Mother    Osteopenia Mother    Breast cancer Mother    Cancer Father 15       Melanoma with lung METS   Colon polyps Sister    Cancer Maternal Aunt        colon   Cancer Paternal Uncle        lung   Stroke Maternal Grandmother    Aneurysm Maternal Grandmother    Hyperlipidemia Maternal Grandfather    Heart disease Maternal Grandfather    Esophageal cancer Neg Hx    Stomach cancer Neg Hx    Colon cancer Neg Hx    Rectal cancer Neg Hx     Social History   Socioeconomic History   Marital status: Married    Spouse name: Not on file   Number of children: 2   Years of education: Not on file   Highest education level: Not on file  Occupational History   Occupation: receptionist  Tobacco Use   Smoking status: Former    Current packs/day: 0.00    Types: Cigarettes    Quit date: 11/06/1992    Years since quitting: 30.3   Smokeless tobacco: Never  Vaping Use   Vaping status: Never Used  Substance and Sexual Activity   Alcohol use: Not Currently   Drug use: No   Sexual activity: Yes    Birth control/protection: Surgical  Other Topics  Concern   Not on file  Social History Narrative   Not on file   Social Determinants of Health   Financial Resource Strain: Not on file  Food Insecurity: Not on file  Transportation Needs: Not on file  Physical Activity: Not on file  Stress: Not on file  Social Connections: Not on file  Intimate Partner Violence: Not on file    Physical Exam: Vital signs in last 24 hours: @BP  (!) 129/59  Pulse 73   Temp (!) 97.5 F (36.4 C) (Skin)   Ht 5\' 5"  (1.651 m)   Wt 122 lb (55.3 kg)   SpO2 100%   BMI 20.30 kg/m  GEN: NAD EYE: Sclerae anicteric ENT: MMM CV: Non-tachycardic Pulm: CTA b/l GI: Soft, NT/ND NEURO:  Alert & Oriented x 3   Doristine Locks, DO [facility]   03/10/2023 10:41 AM

## 2023-03-10 NOTE — Op Note (Signed)
[facility] Patient Name: Yvonne Bell Procedure Date: 03/10/2023 10:41 AM MRN: [id number] Endoscopist: Doristine Locks , MD, [id number] Age: 57 Referring MD:  Date of Birth: Sep 02, 1965 Gender: Female Account #: [id number] Procedure:                Colonoscopy Indications:              Change in bowel habits, Constipation                           Family history notable for maternal aunt with colon                            cancer. Recent CT abdomen/pelvis negative for bowel                            obstruction, showed increase stool burden.                            Attempted colonoscopy yesterday with Dr. Lavon Paganini                            which was notable for large amount of stool                            precluding visualization. She continued clear                            liquids yesterday and completed additional bowel                            prep last evening and returns today for repeat                            attempt at colonoscopy. Medicines:                Monitored Anesthesia Care Procedure:                Pre-Anesthesia Assessment:                           - Prior to the procedure, a History and Physical                            was performed, and patient medications and                            allergies were reviewed. The patient's tolerance of                            previous anesthesia was also reviewed. The risks                            and benefits of the procedure and the sedation  options and risks were discussed with the patient.                            All questions were answered, and informed consent                            was obtained. Prior Anticoagulants: The patient has                            taken no anticoagulant or antiplatelet agents. ASA                            Grade Assessment: II - A patient with mild systemic                            disease. After reviewing the risks and  benefits,                            the patient was deemed in satisfactory condition to                            undergo the procedure.                           After obtaining informed consent, the colonoscope                            was passed under direct vision. Throughout the                            procedure, the patient's blood pressure, pulse, and                            oxygen saturations were monitored continuously. The                            Olympus Scope SN: [id number] was introduced through                            the anus and advanced to the the terminal ileum.                            The colonoscopy was performed without difficulty.                            The patient tolerated the procedure well. The                            quality of the bowel preparation was fair. The                            terminal ileum, ileocecal valve, appendiceal  orifice, and rectum were photographed. Scope In: 10:51:00 AM Scope Out: 11:06:52 AM Scope Withdrawal Time: 0 hours 9 minutes 57 seconds  Total Procedure Duration: 0 hours 15 minutes 52 seconds  Findings:                 The perianal and digital rectal examinations were                            normal.                           A moderate amount of liquid stool mixed with solid                            food debris (ie, seeds, nuts, etc) was found                            scattered throughout the colon, interfering with                            visualization. Lavage of the area was performed                            using copious amounts of sterile water, but the                            colonoscope was clogged several times with small                            particulate matter. Was able to visualize the                            majority of the colonic surface, but overall graded                            as fair visualization since there were areas that                             could not be fully cleared. Cannot rule out the                            presence of small or flat polyps in these areas.                           The visualized mucosa was otherwise normal                            appearing throughout the colon. No areas of mucosal                            erythema, edema, erosions, or ulceration, and no                            luminal narrowing, strictures, or  obstruction noted.                           Retroflexion in the rectum was not performed due to                            anatomy.                           The terminal ileum appeared normal. Complications:            No immediate complications. Estimated Blood Loss:     Estimated blood loss: none. Impression:               - Preparation of the colon was fair.                           - Stool in the entire examined colon.                           - Normal mucosa in the entire examined colon.                           - The examined portion of the ileum was normal.                           - No specimens collected. Recommendation:           - Patient has a contact number available for                            emergencies. The signs and symptoms of potential                            delayed complications were discussed with the                            patient. Return to normal activities tomorrow.                            Written discharge instructions were provided to the                            patient.                           - Resume previous diet.                           - Continue present medications.                           - Consider repeat colonoscopy in 1-2 years for                            screening purposes. Can discuss this timing further  at your follow-up appointment. Recommend using an                            alternate prep regimen and dietary modification in                            teh pre-operative  setting.                           - Follow-up with Quentin Mulling or Dr. Lavon Paganini in                            the GI office at appointment to be scheduled. Doristine Locks, MD 03/10/2023 11:17:58 AM

## 2023-03-11 ENCOUNTER — Telehealth: Payer: Self-pay

## 2023-03-11 NOTE — Telephone Encounter (Signed)
  Follow up Call-     03/10/2023    9:27 AM 03/09/2023    1:55 PM  Call back number  Post procedure Call Back phone  # [phone]  Permission to leave phone message Yes Yes     Patient questions:  Do you have a fever, pain , or abdominal swelling? No. Pain Score  0 *  Have you tolerated food without any problems? Yes.    Have you been able to return to your normal activities? Yes.    Do you have any questions about your discharge instructions: Diet   No. Medications  No. Follow up visit  No.  Do you have questions or concerns about your Care? No.  Actions: * If pain score is 4 or above: No action needed, pain <4.

## 2023-04-26 ENCOUNTER — Other Ambulatory Visit: Payer: Self-pay | Admitting: Nurse Practitioner

## 2023-04-26 DIAGNOSIS — K824 Cholesterolosis of gallbladder: Secondary | ICD-10-CM

## 2023-05-03 ENCOUNTER — Ambulatory Visit (HOSPITAL_BASED_OUTPATIENT_CLINIC_OR_DEPARTMENT_OTHER): Payer: 59

## 2023-05-06 ENCOUNTER — Ambulatory Visit (HOSPITAL_BASED_OUTPATIENT_CLINIC_OR_DEPARTMENT_OTHER)
Admission: RE | Admit: 2023-05-06 | Discharge: 2023-05-06 | Disposition: A | Discharge status: Home / Self Care | Payer: 59 | Source: Ambulatory Visit | Attending: Nurse Practitioner | Admitting: Nurse Practitioner

## 2023-05-06 DIAGNOSIS — K824 Cholesterolosis of gallbladder: Secondary | ICD-10-CM | POA: Diagnosis present

## 2023-06-03 ENCOUNTER — Other Ambulatory Visit: Payer: Self-pay | Admitting: Internal Medicine

## 2023-06-03 ENCOUNTER — Ambulatory Visit: Payer: 59 | Admitting: Physician Assistant

## 2023-06-11 ENCOUNTER — Other Ambulatory Visit: Payer: Self-pay

## 2023-06-11 MED ORDER — LINZESS 290 MCG PO CAPS: 290.0000 ug | ORAL_CAPSULE | Freq: Every day | ORAL | 3 refills | Status: DC

## 2023-06-15 ENCOUNTER — Other Ambulatory Visit: Payer: Self-pay

## 2023-06-15 DIAGNOSIS — M76899 Other specified enthesopathies of unspecified lower limb, excluding foot: Secondary | ICD-10-CM

## 2023-06-15 MED ORDER — PREGABALIN 100 MG PO CAPS
ORAL_CAPSULE | ORAL | 1 refills | Status: AC
Start: 2023-06-15 — End: ?

## 2023-06-15 MED ORDER — VEOZAH 45 MG PO TABS: 1.0000 | ORAL_TABLET | Freq: Every day | ORAL | 3 refills | Status: DC

## 2023-06-21 ENCOUNTER — Other Ambulatory Visit: Payer: Self-pay

## 2023-06-21 DIAGNOSIS — M87051 Idiopathic aseptic necrosis of right femur: Secondary | ICD-10-CM

## 2023-06-21 MED ORDER — DULOXETINE HCL 60 MG PO CPEP: ORAL_CAPSULE | ORAL | 1 refills | Status: AC

## 2023-07-09 ENCOUNTER — Ambulatory Visit: Payer: 59 | Admitting: Physician Assistant

## 2023-07-23 ENCOUNTER — Telehealth: Payer: Self-pay | Admitting: Physician Assistant

## 2023-07-23 ENCOUNTER — Telehealth: Payer: Self-pay | Admitting: Gastroenterology

## 2023-07-23 NOTE — Telephone Encounter (Signed)
 Inbound call from patient requesting to know if she is able to pick up samples of Linzess 290 mg. States she is having a hard time finding provider to refill medication. Requesting a call back. Please advise, thank you

## 2023-07-23 NOTE — Telephone Encounter (Signed)
 Error

## 2023-07-26 MED ORDER — LINZESS 290 MCG PO CAPS: 290.0000 ug | ORAL_CAPSULE | Freq: Every day | ORAL | 3 refills | Status: DC

## 2023-07-26 NOTE — Telephone Encounter (Signed)
 I sent In Linzess to patients pharmacy. Left her a message  We dont have a lot of samples

## 2023-07-28 ENCOUNTER — Encounter: Payer: Self-pay | Admitting: Physician Assistant

## 2023-07-28 ENCOUNTER — Ambulatory Visit: Admitting: Student in an Organized Health Care Education/Training Program

## 2023-07-28 ENCOUNTER — Ambulatory Visit (INDEPENDENT_AMBULATORY_CARE_PROVIDER_SITE_OTHER): Admitting: Physician Assistant

## 2023-07-28 ENCOUNTER — Other Ambulatory Visit (HOSPITAL_BASED_OUTPATIENT_CLINIC_OR_DEPARTMENT_OTHER): Payer: Self-pay

## 2023-07-28 VITALS — BP 126/73 | HR 62 | Ht 65.0 in | Wt 130.0 lb

## 2023-07-28 DIAGNOSIS — F419 Anxiety disorder, unspecified: Secondary | ICD-10-CM | POA: Diagnosis not present

## 2023-07-28 DIAGNOSIS — J302 Other seasonal allergic rhinitis: Secondary | ICD-10-CM | POA: Diagnosis not present

## 2023-07-28 DIAGNOSIS — F32A Depression, unspecified: Secondary | ICD-10-CM | POA: Diagnosis not present

## 2023-07-28 DIAGNOSIS — N951 Menopausal and female climacteric states: Secondary | ICD-10-CM | POA: Diagnosis not present

## 2023-07-28 DIAGNOSIS — N179 Acute kidney failure, unspecified: Secondary | ICD-10-CM | POA: Diagnosis not present

## 2023-07-28 DIAGNOSIS — I10 Essential (primary) hypertension: Secondary | ICD-10-CM

## 2023-07-28 DIAGNOSIS — Z96643 Presence of artificial hip joint, bilateral: Secondary | ICD-10-CM

## 2023-07-28 DIAGNOSIS — K5909 Other constipation: Secondary | ICD-10-CM

## 2023-07-28 MED ORDER — VEOZAH 45 MG PO TABS
1.0000 | ORAL_TABLET | Freq: Every day | ORAL | 1 refills | Status: AC
  Filled 2023-07-28 – 2023-07-30 (×2): qty 90, 90d supply, fill #0
  Filled 2023-10-27 – 2024-01-07 (×6): qty 90, 90d supply, fill #1

## 2023-07-28 MED ORDER — LINZESS 290 MCG PO CAPS
290.0000 ug | ORAL_CAPSULE | Freq: Every day | ORAL | 1 refills | Status: DC
  Filled 2023-07-28: qty 90, 90d supply, fill #0

## 2023-07-28 MED ORDER — MONTELUKAST SODIUM 10 MG PO TABS
10.0000 mg | ORAL_TABLET | Freq: Every day | ORAL | 3 refills | Status: AC
  Filled 2023-07-28: qty 90, 90d supply, fill #0
  Filled 2023-10-27: qty 90, 90d supply, fill #1

## 2023-07-28 NOTE — Progress Notes (Signed)
 New patient visit   Patient: Yvonne Bell   DOB: 1965-12-14   58 y.o. Female  MRN: [id number] Visit Date: 07/28/2023  Today's healthcare provider: Alfredia Ferguson, PA-C   Cc. New pt establish care  Subjective    Yvonne Bell is a 58 y.o. female who presents today as a new patient to establish care. She is in need of some refills. No acute concerns today.  Past Medical History:  Diagnosis Date   Allergic rhinitis    Hyperlipidemia    Osteopenia    Vitamin D deficiency    Past Surgical History:  Procedure Laterality Date   CESAREAN SECTION     COLONOSCOPY     NOVASURE ABLATION  04/27/2005   SKIN SURGERY Left    Moderate atypia, abdomen   WISDOM TOOTH EXTRACTION     Family Status  Relation Name Status   Mother  Alive   Father  Deceased   Sister  Alive   Brother  Alive   Mat Aunt  (Not Specified)   Oneal Grout  (Not Specified)   MGM  (Not Specified)   MGF  (Not Specified)   Neg Hx  (Not Specified)  No partnership data on file   Family History  Problem Relation Age of Onset   Hyperlipidemia Mother    Hypertension Mother    Osteopenia Mother    Breast cancer Mother    Cancer Father 25       Melanoma with lung METS   Colon polyps Sister    Cancer Maternal Aunt        colon   Cancer Paternal Uncle        lung   Stroke Maternal Grandmother    Aneurysm Maternal Grandmother    Hyperlipidemia Maternal Grandfather    Heart disease Maternal Grandfather    Esophageal cancer Neg Hx    Stomach cancer Neg Hx    Colon cancer Neg Hx    Rectal cancer Neg Hx    Social History   Socioeconomic History   Marital status: Married    Spouse name: Not on file   Number of children: 2   Years of education: Not on file   Highest education level: Not on file  Occupational History   Occupation: receptionist  Tobacco Use   Smoking status: Former    Current packs/day: 0.00    Types: Cigarettes    Quit date: 11/06/1992    Years since quitting: 30.7   Smokeless tobacco:  Never  Vaping Use   Vaping status: Never Used  Substance and Sexual Activity   Alcohol use: Not Currently   Drug use: No   Sexual activity: Yes    Birth control/protection: Surgical  Other Topics Concern   Not on file  Social History Narrative   Not on file   Social Drivers of Health   Financial Resource Strain: Not on file  Food Insecurity: Not on file  Transportation Needs: Not on file  Physical Activity: Not on file  Stress: Not on file  Social Connections: Not on file   Outpatient Medications Prior to Visit  Medication Sig   AMBULATORY NON FORMULARY MEDICATION Medication Name: Diltiazem 2%/Lidocaine 2%   Using your index finger apply a small amount of medication inside the anal opening and to the external anal area twice daily x 6 weeks.   Cholecalciferol (VITAMIN D3) 125 MCG (5000 UT) CAPS Take 2 capsules  (10,000 units)  Daily   DULoxetine (CYMBALTA) 60 MG capsule  TAKE 1 CAPSULE BY MOUTH DAILY FOR MOOD OR ANXIETY   losartan (COZAAR) 25 MG tablet TAKE 1 TABLET BY MOUTH DAILY FOR BLOOD PRESSURE   mupirocin ointment (BACTROBAN) 2 % Apply 3 to 4 x /day   pregabalin (LYRICA) 100 MG capsule Take 1 capsule 3 x /day for Chronic Pain   senna-docusate (SENOKOT-S) 8.6-50 MG tablet Take 1 tablet by mouth daily.   Vitamin D-Vitamin K (D3 + K2 PO) Take by mouth. 800IU vit d3, K2 180 mcg   [DISCONTINUED] azithromycin (ZITHROMAX) 250 MG tablet TAKE 2 TABLETS BY MOUTH WITH FOOD FOR 1 DAY THEN TAKE 1 TABLET BY MOUTH DAILY WITH FOOD FOR SINUSITIS OR BRONCHITIS   [DISCONTINUED] Fezolinetant (VEOZAH) 45 MG TABS Take 1 tablet (45 mg total) by mouth daily.   [DISCONTINUED] LINZESS 290 MCG CAPS capsule Take 1 capsule (290 mcg total) by mouth daily.   [DISCONTINUED] montelukast (SINGULAIR) 10 MG tablet Take 1 tablet daily for Allergies & Asthma   [DISCONTINUED] topiramate (TOPAMAX) 50 MG tablet TAKE 1/2 TO 1 TABLET BY MOUTH TWICE DAILY AT SUPPERTIME AND BEDTIME FOR DIETING AND WEIGHT LOSS   No  facility-administered medications prior to visit.   No Known Allergies  Immunization History  Administered Date(s) Administered   Fluzone Influenza virus vaccine,trivalent (IIV3), split virus 03/02/2023   Influenza Inj Mdck Quad With Preservative 03/01/2017, 02/25/2018, 02/07/2019, 02/23/2020   Influenza,inj,Quad PF,6+ Mos 02/20/2021, 03/04/2022   PFIZER(Purple Top)SARS-COV-2 Vaccination 11/24/2019, 12/15/2019, 12/15/2019   PPD Test 06/08/2014   Tdap 05/29/2008, 12/08/2018   Zoster Recombinant(Shingrix) 11/07/2022, 01/10/2023    Health Maintenance  Topic Date Due   Pneumococcal Vaccine 33-33 Years old (1 of 2 - PCV) Never done   HIV Screening  Never done   COVID-19 Vaccine (4 - 2024-25 season) 12/27/2022   INFLUENZA VACCINE  11/26/2023   MAMMOGRAM  12/16/2023   Colonoscopy  03/09/2024   Cervical Cancer Screening (HPV/Pap Cotest)  03/05/2027   DTaP/Tdap/Td (3 - Td or Tdap) 12/07/2028   Hepatitis C Screening  Completed   Zoster Vaccines- Shingrix  Completed   HPV VACCINES  Aged Out    Patient Care Team: Alfredia Ferguson, Cordelia Poche as PCP - General (Physician Assistant) [name], MD (Inactive) as Consulting Physician (Obstetrics and Gynecology)  Review of Systems  Constitutional:  Negative for fatigue and fever.  Respiratory:  Negative for cough and shortness of breath.   Cardiovascular:  Negative for chest pain and leg swelling.  Gastrointestinal:  Negative for abdominal pain.  Neurological:  Negative for dizziness and headaches.        Objective    BP 126/73   Pulse 62   Ht 5\' 5"  (1.651 m)   Wt 130 lb (59 kg)   BMI 21.63 kg/m     Physical Exam Constitutional:      General: She is awake.     Appearance: She is well-developed.  HENT:     Head: Normocephalic.  Eyes:     Conjunctiva/sclera: Conjunctivae normal.  Cardiovascular:     Rate and Rhythm: Normal rate and regular rhythm.     Heart sounds: Normal heart sounds.  Pulmonary:     Effort: Pulmonary  effort is normal.     Breath sounds: Normal breath sounds.  Musculoskeletal:     Right lower leg: No edema.     Left lower leg: No edema.  Skin:    General: Skin is warm.  Neurological:     Mental Status: She is alert and oriented to person,  place, and time.  Psychiatric:        Attention and Perception: Attention normal.        Mood and Affect: Mood normal.        Speech: Speech normal.        Behavior: Behavior is cooperative.    Depression Screen    07/28/2023    1:04 PM 09/26/2020    9:17 PM  PHQ 2/9 Scores  PHQ - 2 Score 0 0   No results found for any visits on 07/28/23.  Assessment & Plan     Essential hypertension Assessment & Plan: Chronic, well controlled Managed with losartan 25 mg daily .    Seasonal allergies Assessment & Plan: Manages with singulair 10 mg  Orders: -     Montelukast Sodium; Take 1 tablet (10 mg total) by mouth daily  for asthma and allergies.  Dispense: 90 tablet; Refill: 3  Hot flashes due to menopause Assessment & Plan: Manages with veozah, refilled  Orders: Allyne Gee; Take 1 tablet (45 mg total) by mouth daily.  Dispense: 90 tablet; Refill: 1  Chronic constipation Assessment & Plan: Follows with GI, manages with Linzess. They had refilled but it went to the wrong pharmacy. Refilled.  Orders: -     Linzess; Take 1 capsule (290 mcg total) by mouth daily.  Dispense: 90 capsule; Refill: 1  Anxiety and depression Assessment & Plan: Manages with cymbalta 60 mg, stable   H/O bilateral hip replacements Assessment & Plan: Pt takes lyrica 100 mg at bedtime for pain relief.  Next rx can update as taking daily   AKI (acute kidney injury) (HCC) -     Comprehensive metabolic panel with GFR  Likely an AKI during acute episode of severe constipation coupled with contrast dye. Repeat cmp today  Return in about 4 months (around 11/27/2023) for CPE.      Alfredia Ferguson, PA-C  [facility] [phone] (phone) [phone] (fax)  [facility]

## 2023-07-28 NOTE — Assessment & Plan Note (Signed)
 Manages with veozah, refilled

## 2023-07-28 NOTE — Assessment & Plan Note (Signed)
 Pt takes lyrica 100 mg at bedtime for pain relief.  Next rx can update as taking daily

## 2023-07-28 NOTE — Assessment & Plan Note (Signed)
 Manages with cymbalta 60 mg, stable

## 2023-07-28 NOTE — Assessment & Plan Note (Signed)
 Follows with GI, manages with Linzess. They had refilled but it went to the wrong pharmacy. Refilled.

## 2023-07-28 NOTE — Assessment & Plan Note (Signed)
 Chronic, well controlled Managed with losartan 25 mg daily .

## 2023-07-28 NOTE — Assessment & Plan Note (Signed)
 Manages with singulair 10 mg

## 2023-07-29 ENCOUNTER — Other Ambulatory Visit (HOSPITAL_BASED_OUTPATIENT_CLINIC_OR_DEPARTMENT_OTHER): Payer: Self-pay

## 2023-07-29 LAB — COMPREHENSIVE METABOLIC PANEL WITH GFR
ALT: 20 U/L (ref 0–35)
AST: 21 U/L (ref 0–37)
Albumin: 4.6 g/dL (ref 3.5–5.2)
Alkaline Phosphatase: 88 U/L (ref 39–117)
BUN: 16 mg/dL (ref 6–23)
CO2: 30 meq/L (ref 19–32)
Calcium: 9.8 mg/dL (ref 8.4–10.5)
Chloride: 102 meq/L (ref 96–112)
Creatinine, Ser: 1.13 mg/dL (ref 0.40–1.20)
GFR: 53.46 mL/min — ABNORMAL LOW (ref >60.00)
Glucose, Bld: 82 mg/dL (ref 70–99)
Potassium: 4.1 meq/L (ref 3.5–5.1)
Sodium: 141 meq/L (ref 135–145)
Total Bilirubin: 0.4 mg/dL (ref 0.2–1.2)
Total Protein: 7 g/dL (ref 6.0–8.3)

## 2023-07-30 ENCOUNTER — Other Ambulatory Visit (HOSPITAL_BASED_OUTPATIENT_CLINIC_OR_DEPARTMENT_OTHER): Payer: Self-pay

## 2023-08-03 ENCOUNTER — Encounter: Payer: 59 | Admitting: Nurse Practitioner

## 2023-10-26 NOTE — Progress Notes (Deleted)
 Complete physical exam   Patient: Yvonne Bell   DOB: Feb 14, 1966   58 y.o. Female  MRN: [id number] Visit Date: 10/27/2023  Today's healthcare provider: Manuelita Flatness, PA-C   No chief complaint on file.  Subjective    Yvonne Bell is a 58 y.o. female who presents today for a complete physical exam.   ***  Past Medical History:  Diagnosis Date   Allergic rhinitis    Hyperlipidemia    Osteopenia    Vitamin D  deficiency    Past Surgical History:  Procedure Laterality Date   CESAREAN SECTION     COLONOSCOPY     NOVASURE ABLATION  04/27/2005   SKIN SURGERY Left    Moderate atypia, abdomen   WISDOM TOOTH EXTRACTION     Social History   Socioeconomic History   Marital status: Married    Spouse name: Not on file   Number of children: 2   Years of education: Not on file   Highest education level: Not on file  Occupational History   Occupation: receptionist  Tobacco Use   Smoking status: Former    Current packs/day: 0.00    Types: Cigarettes    Quit date: 11/06/1992    Years since quitting: 30.9   Smokeless tobacco: Never  Vaping Use   Vaping status: Never Used  Substance and Sexual Activity   Alcohol use: Not Currently   Drug use: No   Sexual activity: Yes    Birth control/protection: Surgical  Other Topics Concern   Not on file  Social History Narrative   Not on file   Social Drivers of Health   Financial Resource Strain: Not on file  Food Insecurity: Not on file  Transportation Needs: Not on file  Physical Activity: Not on file  Stress: Not on file  Social Connections: Not on file  Intimate Partner Violence: Not on file   Family Status  Relation Name Status   Mother  Alive   Father  Deceased   Sister  Alive   Brother  Alive   Mat Aunt  (Not Specified)   Nutritional therapist  (Not Specified)   MGM  (Not Specified)   MGF  (Not Specified)   Neg Hx  (Not Specified)  No partnership data on file   Family History  Problem Relation Age of Onset    Hyperlipidemia Mother    Hypertension Mother    Osteopenia Mother    Breast cancer Mother    Cancer Father 24       Melanoma with lung METS   Colon polyps Sister    Cancer Maternal Aunt        colon   Cancer Paternal Uncle        lung   Stroke Maternal Grandmother    Aneurysm Maternal Grandmother    Hyperlipidemia Maternal Grandfather    Heart disease Maternal Grandfather    Esophageal cancer Neg Hx    Stomach cancer Neg Hx    Colon cancer Neg Hx    Rectal cancer Neg Hx    No Known Allergies  Patient Care Team: Flatness Manuelita, PA-C as PCP - General (Physician Assistant) Rosalynn LELON Ingle, MD (Inactive) as Consulting Physician (Obstetrics and Gynecology)   Medications: Outpatient Medications Prior to Visit  Medication Sig   AMBULATORY NON FORMULARY MEDICATION Medication Name: Diltiazem 2%/Lidocaine 2%   Using your index finger apply a small amount of medication inside the anal opening and to the external anal area twice daily x 6  weeks.   Cholecalciferol (VITAMIN D3) 125 MCG (5000 UT) CAPS Take 2 capsules  (10,000 units)  Daily   DULoxetine  (CYMBALTA ) 60 MG capsule TAKE 1 CAPSULE BY MOUTH DAILY FOR MOOD OR ANXIETY   Fezolinetant  (VEOZAH ) 45 MG TABS Take 1 tablet (45 mg total) by mouth daily.   LINZESS  290 MCG CAPS capsule Take 1 capsule (290 mcg total) by mouth daily.   losartan  (COZAAR ) 25 MG tablet TAKE 1 TABLET BY MOUTH DAILY FOR BLOOD PRESSURE   montelukast  (SINGULAIR ) 10 MG tablet Take 1 tablet (10 mg total) by mouth daily  for asthma and allergies.   mupirocin  ointment (BACTROBAN ) 2 % Apply 3 to 4 x /day   pregabalin  (LYRICA ) 100 MG capsule Take 1 capsule 3 x /day for Chronic Pain   senna-docusate (SENOKOT-S) 8.6-50 MG tablet Take 1 tablet by mouth daily.   Vitamin D -Vitamin K (D3 + K2 PO) Take by mouth. 800IU vit d3, K2 180 mcg   No facility-administered medications prior to visit.    Review of Systems {Insert previous labs (optional):23779} {See past labs  Heme   Chem  Endocrine  Serology  Results Review (optional):1}  Objective    There were no vitals taken for this visit. {Insert last BP/Wt (optional):23777}{See vitals history (optional):1}  Physical Exam  ***  Last depression screening scores    07/28/2023    1:04 PM 09/26/2020    9:17 PM  PHQ 2/9 Scores  PHQ - 2 Score 0 0   Last fall risk screening    07/28/2023    1:04 PM  Fall Risk   Falls in the past year? 0  Number falls in past yr: 0  Injury with Fall? 0  Risk for fall due to : No Fall Risks  Follow up Falls evaluation completed   Last Audit-C alcohol use screening     No data to display         A score of 3 or more in women, and 4 or more in men indicates increased risk for alcohol abuse, EXCEPT if all of the points are from question 1   No results found for any visits on 10/27/23.  Assessment & Plan    Routine Health Maintenance and Physical Exam  Exercise Activities and Dietary recommendations  Goals   None    --balanced diet high in fiber and protein, low in sugars, carbs, fats. --physical activity/exercise 20-30 minutes 3-5 times a week    Immunization History  Administered Date(s) Administered   Fluzone  Influenza virus vaccine,trivalent (IIV3), split virus 03/02/2023   Influenza Inj Mdck Quad With Preservative 03/01/2017, 02/25/2018, 02/07/2019, 02/23/2020   Influenza,inj,Quad PF,6+ Mos 02/20/2021, 03/04/2022   PFIZER(Purple Top)SARS-COV-2 Vaccination 11/24/2019, 12/15/2019, 12/15/2019   PPD Test 06/08/2014   Tdap 05/29/2008, 12/08/2018   Zoster Recombinant(Shingrix) 11/07/2022, 01/10/2023    Health Maintenance  Topic Date Due   HIV Screening  Never done   Pneumococcal Vaccine 41-83 Years old (1 of 2 - PCV) Never done   Hepatitis B Vaccines (1 of 3 - 19+ 3-dose series) Never done   COVID-19 Vaccine (4 - 2024-25 season) 12/27/2022   INFLUENZA VACCINE  11/26/2023   MAMMOGRAM  12/16/2023   Colonoscopy  03/09/2024   Cervical Cancer Screening  (HPV/Pap Cotest)  03/05/2027   DTaP/Tdap/Td (3 - Td or Tdap) 12/07/2028   Hepatitis C Screening  Completed   Zoster Vaccines- Shingrix  Completed   HPV VACCINES  Aged Out   Meningococcal B Vaccine  Aged Out  Discussed health benefits of physical activity, and encouraged her to engage in regular exercise appropriate for her age and condition.  ***  No follow-ups on file.     Manuelita Flatness, PA-C  [facility] [phone] (phone) [phone] (fax)  [facility]

## 2023-10-27 ENCOUNTER — Telehealth: Payer: Self-pay | Admitting: *Deleted

## 2023-10-27 ENCOUNTER — Other Ambulatory Visit (HOSPITAL_BASED_OUTPATIENT_CLINIC_OR_DEPARTMENT_OTHER): Payer: Self-pay

## 2023-10-27 ENCOUNTER — Encounter: Admitting: Physician Assistant

## 2023-10-27 DIAGNOSIS — E782 Mixed hyperlipidemia: Secondary | ICD-10-CM

## 2023-10-27 DIAGNOSIS — F419 Anxiety disorder, unspecified: Secondary | ICD-10-CM

## 2023-10-27 DIAGNOSIS — I10 Essential (primary) hypertension: Secondary | ICD-10-CM

## 2023-10-27 DIAGNOSIS — M8580 Other specified disorders of bone density and structure, unspecified site: Secondary | ICD-10-CM

## 2023-10-27 NOTE — Telephone Encounter (Signed)
 [facility] notifying pt that we don't carry those samples.  Copied from CRM [id number]. Topic: Clinical - Medication Question >> Oct 27, 2023  8:08 AM Laymon HERO wrote: Reason for CRM: Patient wanting to know if there is any samples available Veozah , the medication is too expensive for her right now.

## 2023-12-03 ENCOUNTER — Ambulatory Visit: Admitting: Physician Assistant

## 2023-12-10 ENCOUNTER — Other Ambulatory Visit (HOSPITAL_COMMUNITY): Payer: Self-pay

## 2023-12-10 ENCOUNTER — Telehealth: Payer: Self-pay | Admitting: Physician Assistant

## 2023-12-10 ENCOUNTER — Other Ambulatory Visit (HOSPITAL_BASED_OUTPATIENT_CLINIC_OR_DEPARTMENT_OTHER): Payer: Self-pay

## 2023-12-10 ENCOUNTER — Telehealth: Payer: Self-pay

## 2023-12-10 NOTE — Telephone Encounter (Unsigned)
 Copied from CRM #[id number]. Topic: Clinical - Medication Prior Auth >> Dec 10, 2023 12:31 PM Mia F wrote: Reason for CRM: Pt says her pharmacy says they need a call from pcp office for a PA for the medication Fezolinetant  (VEOZAH ) 45 MG TABS. Because pt has a new insurance. Please advise   Pharmacy: [facility] [address] [place] [address] Phone: [phone] Fax: [phone]

## 2023-12-10 NOTE — Telephone Encounter (Signed)
 Pharmacy Patient Advocate Encounter   Received notification from CoverMyMeds that prior authorization for Veozah  45MG  tablets is required/requested.   Insurance verification completed.   The patient is insured through [employer] .   Per test claim: PA required; PA submitted to above mentioned insurance via Latent Key/confirmation #/EOC [id number] Status is pending

## 2023-12-13 ENCOUNTER — Other Ambulatory Visit (HOSPITAL_BASED_OUTPATIENT_CLINIC_OR_DEPARTMENT_OTHER): Payer: Self-pay

## 2023-12-13 ENCOUNTER — Other Ambulatory Visit: Payer: Self-pay | Admitting: Physician Assistant

## 2023-12-13 ENCOUNTER — Other Ambulatory Visit (HOSPITAL_COMMUNITY): Payer: Self-pay

## 2023-12-13 DIAGNOSIS — Z1231 Encounter for screening mammogram for malignant neoplasm of breast: Secondary | ICD-10-CM

## 2023-12-13 NOTE — Telephone Encounter (Signed)
-   Additional info submitted to fax [phone].

## 2023-12-14 ENCOUNTER — Other Ambulatory Visit (HOSPITAL_BASED_OUTPATIENT_CLINIC_OR_DEPARTMENT_OTHER): Payer: Self-pay

## 2023-12-16 ENCOUNTER — Other Ambulatory Visit (HOSPITAL_COMMUNITY): Payer: Self-pay

## 2023-12-17 ENCOUNTER — Other Ambulatory Visit (HOSPITAL_BASED_OUTPATIENT_CLINIC_OR_DEPARTMENT_OTHER): Payer: Self-pay

## 2023-12-17 NOTE — Telephone Encounter (Signed)
 Pharmacy Patient Advocate Encounter  Received notification from [facility] that Prior Authorization for Veozah  45MG  tablets  has been DENIED.  Full denial letter will be uploaded to the media tab. See denial reason below.   PA #/Case ID/Reference #: [id number]

## 2023-12-20 ENCOUNTER — Other Ambulatory Visit (HOSPITAL_COMMUNITY): Payer: Self-pay

## 2023-12-20 ENCOUNTER — Other Ambulatory Visit (HOSPITAL_BASED_OUTPATIENT_CLINIC_OR_DEPARTMENT_OTHER): Payer: Self-pay

## 2023-12-21 ENCOUNTER — Other Ambulatory Visit (HOSPITAL_COMMUNITY): Payer: Self-pay

## 2023-12-21 NOTE — Telephone Encounter (Unsigned)
 Copied from CRM [id number]. Topic: Clinical - Medication Prior Auth >> Dec 21, 2023 12:23 PM Yvonne Bell wrote: Reason for CRM: Patient is calling in stating that her insurance company told her to have the PCP call the providers line regarding getting the Veozah  45MG  tablets approved. She provided the number of [phone].  LMOM for pt to return call regarding having tried alternatives in the past.

## 2023-12-23 NOTE — Telephone Encounter (Signed)
 Patient return call pertaining to previous medication discussion and would like a call back

## 2023-12-24 ENCOUNTER — Telehealth: Payer: Self-pay | Admitting: Pharmacist

## 2023-12-24 NOTE — Telephone Encounter (Signed)
 An E-Appeal has been submitted for Veozah . Will advise when response is received, please be advised that most companies may take 30 days to make a decision. Appeal letter and supporting documentation have been uploaded and submitted via [employer] website on 12/24/2023 @11 :10am.  Thank you, Devere Pandy, PharmD Clinical Pharmacist  [facility]  Direct Dial: [phone]

## 2023-12-28 ENCOUNTER — Other Ambulatory Visit (HOSPITAL_COMMUNITY): Payer: Self-pay

## 2023-12-28 NOTE — Progress Notes (Unsigned)
 12/28/2023 Yvonne Bell [id number] [birth year]  Referring provider: Tonita Fallow, MD Primary GI doctor: Dr. Shila  ASSESSMENT AND PLAN:   Chronic Constipation Longstanding history of constipation, recently worsened. Recent changes in diet and medication (weight loss shots) may have contributed though this was over a year ago. Bright red blood per rectum and mucus in stool, possibly due to anal fissure from straining. No systemic symptoms. CT unremarkable recently other than stool burden -Schedule colonoscopy on 02/22/2023 due to symptoms and family history of colon cancer at [facility] with Dr. Shila. - We have discussed the risks of bleeding, infection, perforation, medication reactions, and remote risk of death associated with colonoscopy. All questions were answered and the patient acknowledges these risk and wishes to proceed. -Continue Linzess  290mcg daily. -Prescribe medication for anal fissure. -Encourage high fiber diet and adequate hydration.  Anal Fissure Likely secondary to chronic constipation and straining. Bright red blood per rectum and discomfort reported. -Prescribe medication for anal fissure. -Continue Linzess  290mcg daily for constipation.   Patient Care Team: Cyndi Shaver, PA-C as PCP - General (Physician Assistant) Rosalynn LELON Ingle, MD (Inactive) as Consulting Physician (Obstetrics and Gynecology)  HISTORY OF PRESENT ILLNESS: 58 y.o. female with a past medical history of hyperlipidemia, osteopenia, vitamin D  deficiency and others listed below presents for evaluation of constipation.   02/10/2023 CT abdomen pelvis with contrast for suspected bowel obstruction constipation showed small gallstone normal liver, unremarkable pancreas subcentimeter hypodensities in the spleen likely benign large volume stool throughout the colon no bowel wall thickening otherwise unremarkable  Discussed the use of AI scribe software for clinical note transcription with the  patient, who gave verbal consent to proceed.  The patient, with a history of chronic constipation, presents with recent changes in bowel habits. She reports a lifelong history of constipation, which was exacerbated by weight loss injections in the past but has been off for the last year. Recently, she noticed an improvement in bowel movements, which she attributes to dietary changes, including daily apple consumption but she had acute constipation over the last month, uncertain the cause. She may have had less nuts, started on probiotic, denies new medications or supplements other than vitamin K.   The patient also reports rectal bleeding, described as bright red blood on the toilet paper, and the presence of mucus in her stool. She denies any associated pain, fever, chills, or significant unintentional weight loss. She has been taking Linzess  for about a week, which resulted in a satisfactory bowel movement a few days ago. She also reports using enemas and magnesium  citrate without significant relief.  The patient has a family history of colon cancer, with her maternal aunt passing away from the disease two years ago. She has never had a colonoscopy or any other colorectal cancer screening tests.      She  reports that she quit smoking about 31 years ago. Her smoking use included cigarettes. She has never used smokeless tobacco. She reports that she does not currently use alcohol. She reports that she does not use drugs.  RELEVANT LABS AND IMAGING:   CBC    Component Value Date/Time   WBC 6.4 02/02/2023 1208   RBC 3.71 (L) 02/02/2023 1208   HGB 11.8 02/02/2023 1208   HCT 35.5 02/02/2023 1208   PLT 322 02/02/2023 1208   MCV 95.7 02/02/2023 1208   MCH 31.8 02/02/2023 1208   MCHC 33.2 02/02/2023 1208   RDW 12.0 02/02/2023 1208   LYMPHSABS 1,786 02/02/2023  1208   MONOABS 0.7 06/08/2014 1144   EOSABS 173 02/02/2023 1208   BASOSABS 58 02/02/2023 1208   Recent Labs    02/02/23 1208  HGB  11.8    CMP     Component Value Date/Time   NA 141 07/28/2023 1314   K 4.1 07/28/2023 1314   CL 102 07/28/2023 1314   CO2 30 07/28/2023 1314   GLUCOSE 82 07/28/2023 1314   BUN 16 07/28/2023 1314   CREATININE 1.13 07/28/2023 1314   CREATININE 1.23 (H) 02/08/2023 1049   CALCIUM  9.8 07/28/2023 1314   PROT 7.0 07/28/2023 1314   ALBUMIN 4.6 07/28/2023 1314   AST 21 07/28/2023 1314   ALT 20 07/28/2023 1314   ALKPHOS 88 07/28/2023 1314   BILITOT 0.4 07/28/2023 1314   GFRNONAA 91 10/09/2020 1020   GFRAA 105 10/09/2020 1020      Latest Ref Rng & Units 07/28/2023    1:14 PM 02/02/2023   12:08 PM 08/03/2022    9:35 AM  Hepatic Function  Total Protein 6.0 - 8.3 g/dL 7.0  6.8  6.5   Albumin 3.5 - 5.2 g/dL 4.6     AST 0 - 37 U/L 21  20  30    ALT 0 - 35 U/L 20  27  36   Alk Phosphatase 39 - 117 U/L 88     Total Bilirubin 0.2 - 1.2 mg/dL 0.4  0.4  0.4       Current Medications:   Current Outpatient Medications (Endocrine & Metabolic):    Fezolinetant  (VEOZAH ) 45 MG TABS, Take 1 tablet (45 mg total) by mouth daily.  Current Outpatient Medications (Cardiovascular):    losartan  (COZAAR ) 25 MG tablet, TAKE 1 TABLET BY MOUTH DAILY FOR BLOOD PRESSURE  Current Outpatient Medications (Respiratory):    montelukast  (SINGULAIR ) 10 MG tablet, Take 1 tablet (10 mg total) by mouth daily  for asthma and allergies.    Current Outpatient Medications (Other):    AMBULATORY NON FORMULARY MEDICATION, Medication Name: Diltiazem 2%/Lidocaine 2%   Using your index finger apply a small amount of medication inside the anal opening and to the external anal area twice daily x 6 weeks.   Cholecalciferol (VITAMIN D3) 125 MCG (5000 UT) CAPS, Take 2 capsules  (10,000 units)  Daily   DULoxetine  (CYMBALTA ) 60 MG capsule, TAKE 1 CAPSULE BY MOUTH DAILY FOR MOOD OR ANXIETY   LINZESS  290 MCG CAPS capsule, Take 1 capsule (290 mcg total) by mouth daily.   mupirocin  ointment (BACTROBAN ) 2 %, Apply 3 to 4 x /day    pregabalin  (LYRICA ) 100 MG capsule, Take 1 capsule 3 x /day for Chronic Pain   senna-docusate (SENOKOT-S) 8.6-50 MG tablet, Take 1 tablet by mouth daily.   Vitamin D -Vitamin K (D3 + K2 PO), Take by mouth. 800IU vit d3, K2 180 mcg  Medical History:  Past Medical History:  Diagnosis Date   Allergic rhinitis    Hyperlipidemia    Osteopenia    Vitamin D  deficiency    Allergies:  No Known Allergies    Surgical History:  She  has a past surgical history that includes Cesarean section; Novasure ablation (04/27/2005); Wisdom tooth extraction; Skin surgery (Left); and Colonoscopy. Family History:  Her family history includes Aneurysm in her maternal grandmother; Breast cancer in her mother; Cancer in her maternal aunt and paternal uncle; Cancer (age of onset: 52) in her father; Colon polyps in her sister; Heart disease in her maternal grandfather; Hyperlipidemia in her maternal grandfather and  mother; Hypertension in her mother; Osteopenia in her mother; Stroke in her maternal grandmother.  REVIEW OF SYSTEMS  : All other systems reviewed and negative except where noted in the History of Present Illness.  PHYSICAL EXAM: There were no vitals taken for this visit. General Appearance: Well nourished, in no apparent distress. Head:   Normocephalic and atraumatic. Eyes:  sclerae anicteric,conjunctive pink  Respiratory: Respiratory effort normal, BS equal bilaterally without rales, rhonchi, wheezing. Cardio: RRR with no MRGs. Peripheral pulses intact.  Abdomen: Soft,  Non-distended ,active bowel sounds. No tenderness . No masses. Rectal: superior fissure noted, normal rectal tone, no internal hemorrhoids appreciated, no masses, non tender, scant stool, hemoccult positive Musculoskeletal: Full ROM, Normal gait. Without edema. Skin:  Dry and intact without significant lesions or rashes Neuro: Alert and  oriented x4;  No focal deficits. Psych:  Cooperative. Normal mood and affect.    Alan JONELLE Coombs, PA-C 1:10 PM

## 2023-12-30 ENCOUNTER — Other Ambulatory Visit (HOSPITAL_BASED_OUTPATIENT_CLINIC_OR_DEPARTMENT_OTHER): Payer: Self-pay

## 2023-12-30 ENCOUNTER — Other Ambulatory Visit (HOSPITAL_COMMUNITY): Payer: Self-pay

## 2023-12-31 ENCOUNTER — Ambulatory Visit (INDEPENDENT_AMBULATORY_CARE_PROVIDER_SITE_OTHER): Admitting: Physician Assistant

## 2023-12-31 ENCOUNTER — Encounter: Payer: Self-pay | Admitting: Physician Assistant

## 2023-12-31 VITALS — BP 142/90 | HR 76 | Ht 63.75 in | Wt 143.4 lb

## 2023-12-31 DIAGNOSIS — K828 Other specified diseases of gallbladder: Secondary | ICD-10-CM

## 2023-12-31 DIAGNOSIS — K5909 Other constipation: Secondary | ICD-10-CM | POA: Diagnosis not present

## 2023-12-31 DIAGNOSIS — K602 Anal fissure, unspecified: Secondary | ICD-10-CM | POA: Diagnosis not present

## 2023-12-31 DIAGNOSIS — K824 Cholesterolosis of gallbladder: Secondary | ICD-10-CM

## 2023-12-31 DIAGNOSIS — K5904 Chronic idiopathic constipation: Secondary | ICD-10-CM

## 2023-12-31 MED ORDER — IBSRELA 50 MG PO TABS: 50.0000 mg | ORAL_TABLET | Freq: Two times a day (BID) | ORAL | Status: DC

## 2023-12-31 NOTE — Patient Instructions (Addendum)
 _______________________________________________________  If your blood pressure at your visit was 140/90 or greater, please contact your primary care physician to follow up on this.  _______________________________________________________  If you are age 59 or older, your body mass index should be between 23-30. Your Body mass index is 24.8 kg/m. If this is out of the aforementioned range listed, please consider follow up with your Primary Care Provider.  If you are age 27 or younger, your body mass index should be between 19-25. Your Body mass index is 24.8 kg/m. If this is out of the aformentioned range listed, please consider follow up with your Primary Care Provider.   ________________________________________________________  The [facility] providers would like to encourage you to use MYCHART to communicate with providers for non-urgent requests or questions.  Due to long hold times on the telephone, sending your provider a message by [facility] may be a faster and more efficient way to get a response.  Please allow 48 business hours for a response.  Please remember that this is for non-urgent requests.  _______________________________________________________  [facility] is using a team-based approach to care.  Your team is made up of your doctor and two to three APPS. Our APPS (Nurse Practitioners and Physician Assistants) work with your physician to ensure care continuity for you. They are fully qualified to address your health concerns and develop a treatment plan. They communicate directly with your gastroenterologist to care for you. Seeing the Advanced Practice Practitioners on your physician's team can help you by facilitating care more promptly, often allowing for earlier appointments, access to diagnostic testing, procedures, and other specialty referrals.    DISCONTINUE: Linzess   Please do the following: Purchase a bottle of Miralax over the counter as well as a box of  5 mg dulcolax tablets. Take 4 dulcolax tablets. Wait 1 hour. You will then drink 6-8 capfuls of Miralax mixed in an adequate amount of water/juice/gatorade (you may choose which of these liquids to drink) over the next 2-3 hours. You should expect results within 1 to 6 hours after completing the bowel purge. Go to the er if you have severe AB pain, can not pass gas or stool in over 12 hours, can not hold down any food.   Can do trial of isbrela for IBS constipation Take Ibsrela  (tenapanor) 5 to 10 minutes before eating breakfast and dinner. This can make the medication work better for you compared to taking it on an empty stomach Stop if you have any significant diarrhea/dehydration.  Toileting tips to help with your constipation - Drink at least 64-80 ounces of water/liquid per day. - Establish a time to try to move your bowels every day.  For many people, this is after a cup of coffee or after a meal such as breakfast. - Sit all of the way back on the toilet keeping your back fairly straight and while sitting up, try to rest the tops of your forearms on your upper thighs.   - Raising your feet with a step stool/squatty potty can be helpful to improve the angle that allows your stool to pass through the rectum. - Relax the rectum feeling it bulge toward the toilet water.  If you feel your rectum raising toward your body, you are contracting rather than relaxing. - Breathe in and slowly exhale. Belly breath by expanding your belly towards your belly button. Keep belly expanded as you gently direct pressure down and back to the anus.  A low pitched GRRR sound can assist with  increasing intra-abdominal pressure.  (Can also trying to blow on a pinwheel and make it move, this helps with the same belly breathing) - Repeat 3-4 times. If unsuccessful, contract the pelvic floor to restore normal tone and get off the toilet.  Avoid excessive straining. - To reduce excessive wiping by teaching your anus  to normally contract, place hands on outer aspect of knees and resist knee movement outward.  Hold 5-10 second then place hands just inside of knees and resist inward movement of knees.  Hold 5 seconds.  Repeat a few times each way.  Go to the ER if unable to pass gas, severe AB pain, unable to hold down food, any shortness of breath of chest pain.   Here some information about pelvic floor dysfunction. This may be contributing to some of your symptoms. We will continue with our evaluation but I do want you to consider adding on fiber supplement with low-dose MiraLAX daily. We could also refer to pelvic floor physical therapy.   Pelvic Floor Dysfunction, Female Pelvic floor dysfunction (PFD) is a condition that results when the group of muscles and connective tissues that support the organs in the pelvis (pelvic floor muscles) do not work well. These muscles and their connections form a sling that supports the colon and bladder. In women, they also support the uterus. PFD causes pelvic floor muscles to be too weak, too tight, or both. In PFD, muscle movements are not coordinated. This may cause bowel or bladder problems. It may also cause pain. What are the causes? This condition may be caused by an injury to the pelvic area or by a weakening of pelvic muscles. This often results from pregnancy and childbirth or other types of strain. In many cases, the exact cause is not known. What increases the risk? The following factors may make you more likely to develop this condition: Having chronic bladder tissue inflammation (interstitial cystitis). Being an older person. Being overweight. History of radiation treatment for cancer in the pelvic region. Previous pelvic surgery, such as removal of the uterus (hysterectomy). What are the signs or symptoms? Symptoms of this condition vary and may include: Bladder symptoms, such as: Trouble starting urination and emptying the bladder. Frequent urinary  tract infections. Leaking urine when coughing, laughing, or exercising (stress incontinence). Having to pass urine urgently or frequently. Pain when passing urine. Bowel symptoms, such as: Constipation. Urgent or frequent bowel movements. Incomplete bowel movements. Painful bowel movements. Leaking stool or gas. Unexplained genital or rectal pain. Genital or rectal muscle spasms. Low back pain. Other symptoms may include: A heavy, full, or aching feeling in the vagina. A bulge that protrudes into the vagina. Pain during or after sex. How is this diagnosed? This condition may be diagnosed based on: Your symptoms and medical history. A physical exam. During the exam, your health care provider may check your pelvic muscles for tightness, spasm, pain, or weakness. This may include a rectal exam and a pelvic exam. In some cases, you may have diagnostic tests, such as: Electrical muscle function tests. Urine flow testing. X-ray tests of bowel function. Ultrasound of the pelvic organs. How is this treated? Treatment for this condition depends on the symptoms. Treatment options include: Physical therapy. This may include Kegel exercises to help relax or strengthen the pelvic floor muscles. Biofeedback. This type of therapy provides feedback on how tight your pelvic floor muscles are so that you can learn to control them. Internal or external massage therapy. A treatment that involves  electrical stimulation of the pelvic floor muscles to help control pain (transcutaneous electrical nerve stimulation, or TENS). Sound wave therapy (ultrasound) to reduce muscle spasms. Medicines, such as: Muscle relaxants. Bladder control medicines. Surgery to reconstruct or support pelvic floor muscles may be an option if other treatments do not help. Follow these instructions at home: Activity Do your usual activities as told by your health care provider. Ask your health care provider if you should  modify any activities. Do pelvic floor strengthening or relaxing exercises at home as told by your physical therapist. Lifestyle Maintain a healthy weight. Eat foods that are high in fiber, such as beans, whole grains, and fresh fruits and vegetables. Limit foods that are high in fat and processed sugars, such as fried or sweet foods. Manage stress with relaxation techniques such as yoga or meditation. General instructions If you have problems with leakage: Use absorbable pads or wear padded underwear. Wash frequently with mild soap. Keep your genital and anal area as clean and dry as possible. Ask your health care provider if you should try a barrier cream to prevent skin irritation. Take warm baths to relieve pelvic muscle tension or spasms. Take over-the-counter and prescription medicines only as told by your health care provider. Keep all follow-up visits. How is this prevented? The cause of PFD is not always known, but there are a few things you can do to reduce the risk of developing this condition, including: Staying at a healthy weight. Getting regular exercise. Managing stress. Contact a health care provider if: Your symptoms are not improving with home care. You have signs or symptoms of PFD that get worse at home. You develop new signs or symptoms. You have signs of a urinary tract infection, such as: Fever. Chills. Increased urinary frequency. A burning feeling when urinating. You have not had a bowel movement in 3 days (constipation). Summary Pelvic floor dysfunction results when the muscles and connective tissues in your pelvic floor do not work well. These muscles and their connections form a sling that supports your colon and bladder. In women, they also support the uterus. PFD may be caused by an injury to the pelvic area or by a weakening of pelvic muscles. PFD causes pelvic floor muscles to be too weak, too tight, or a combination of both. Symptoms may vary from  person to person. In most cases, PFD can be treated with physical therapies and medicines. Surgery may be an option if other treatments do not help. This information is not intended to replace advice given to you by your health care provider. Make sure you discuss any questions you have with your health care provider. Document Revised: 08/21/2020 Document Reviewed: 08/21/2020 Elsevier Patient Education  2022 Elsevier Inc.  Thank you for entrusting me with your care and choosing [facility].  Alan Coombs, PA-C

## 2024-01-04 ENCOUNTER — Other Ambulatory Visit (HOSPITAL_COMMUNITY): Payer: Self-pay

## 2024-01-04 NOTE — Telephone Encounter (Signed)
 Based on test claim, the appeal for [place]  has been approved:

## 2024-01-07 ENCOUNTER — Other Ambulatory Visit (HOSPITAL_BASED_OUTPATIENT_CLINIC_OR_DEPARTMENT_OTHER): Payer: Self-pay

## 2024-01-13 ENCOUNTER — Other Ambulatory Visit (HOSPITAL_BASED_OUTPATIENT_CLINIC_OR_DEPARTMENT_OTHER): Payer: Self-pay

## 2024-01-15 ENCOUNTER — Other Ambulatory Visit: Payer: Self-pay | Admitting: Nurse Practitioner

## 2024-01-15 ENCOUNTER — Other Ambulatory Visit: Payer: Self-pay | Admitting: Family

## 2024-01-15 DIAGNOSIS — M87052 Idiopathic aseptic necrosis of left femur: Secondary | ICD-10-CM

## 2024-01-17 ENCOUNTER — Telehealth: Payer: Self-pay | Admitting: Physician Assistant

## 2024-01-17 DIAGNOSIS — K5904 Chronic idiopathic constipation: Secondary | ICD-10-CM

## 2024-01-17 MED ORDER — IBSRELA 50 MG PO TABS: 50.0000 mg | ORAL_TABLET | Freq: Two times a day (BID) | ORAL | 6 refills | Status: DC

## 2024-01-17 NOTE — Telephone Encounter (Signed)
 Rx for Ibsrela  sent to [facility] as requested.

## 2024-01-17 NOTE — Telephone Encounter (Signed)
 Patient requesting prescription fro ibfrela medication sent into [facility] in [place]. Please advise.   Thank you

## 2024-01-18 ENCOUNTER — Other Ambulatory Visit (HOSPITAL_COMMUNITY): Payer: Self-pay

## 2024-01-18 ENCOUNTER — Telehealth: Payer: Self-pay

## 2024-01-18 NOTE — Telephone Encounter (Signed)
 Could you please submit a prior authorization for Ibsrela  50mg  one tablet two times daily.  Dx: chronic idiopathic constipation.   Thank you

## 2024-01-18 NOTE — Telephone Encounter (Signed)
 PA request has been Submitted. New Encounter has been or will be created for follow up. For additional info see Pharmacy Prior Auth telephone encounter from 01-18-2024.

## 2024-01-18 NOTE — Telephone Encounter (Signed)
 Inbound call from patient stating she went to pick up Ibsrela  and her insurance will not cover and will cost her 2,000 dollars. Patient is requesting a call to discuss. Please advise.

## 2024-01-18 NOTE — Telephone Encounter (Signed)
 Pharmacy Patient Advocate Encounter   Received notification from Pt Calls Messages that prior authorization for Ibsrela  50MG  tablets is required/requested.   Insurance verification completed.   The patient is insured through [facility] .   Per test claim: PA required; PA submitted to above mentioned insurance via Latent Key/confirmation #/EOC [id number] Status is pending

## 2024-01-19 NOTE — Telephone Encounter (Signed)
 Left message on patient voicemail to make her aware that prior auth team is working to complete authorization.  We will contact the patient once a decision has been made.

## 2024-01-20 MED ORDER — IBSRELA 50 MG PO TABS: 50.0000 mg | ORAL_TABLET | Freq: Two times a day (BID) | ORAL | 3 refills | Status: DC

## 2024-01-20 NOTE — Telephone Encounter (Signed)
 See alternate phone note 01/17/24.

## 2024-01-20 NOTE — Telephone Encounter (Signed)
 Left message for patient to make her aware that Elnor was sent to [facility].  Advised patient to return call to the office to further discuss. Will continue efforts.

## 2024-01-20 NOTE — Telephone Encounter (Signed)
 Spoke with [employer] and they advised the authorization for Ibsrela  was denied. Stated to try Trulance and that it would need a separate authorization.

## 2024-01-20 NOTE — Addendum Note (Signed)
 Addended by: CRAIG PALMA on: 01/20/2024 03:56 PM   Modules accepted: Orders

## 2024-01-20 NOTE — Telephone Encounter (Signed)
  Insurance will not cover Ibsrela .  Patient must try and fail Trulance:  See letter below:  Please advise.  Thank you  Pharmacy Patient Advocate Encounter   Received notification from [facility] that Prior Authorization for Ibsrela  50MG  tablets has been DENIED.  Full denial letter will be uploaded to the media tab. See denial reason below.   This medication is not on the formulary and is approved for members who have irritable bowel syndrome with constipation (a condition that causes problems passing solid waste from the body). The member must try and fail (did not work), or be unable to take ALL formulary alternatives (due to interactions, side effects, etc.), one of which must be Trulance.    In this case other formulary alternatives are available for the member to take. The member has tried lubiprostone (generic Amitiza)

## 2024-01-20 NOTE — Telephone Encounter (Signed)
 Pharmacy Patient Advocate Encounter  Received notification from [facility] that Prior Authorization for Ibsrela  50MG  tablets has been DENIED.  Full denial letter will be uploaded to the media tab. See denial reason below.  This medication is not on the formulary and is approved for members who have irritable bowel syndrome with constipation (a condition that causes problems passing solid waste from the body). The member must try and fail (did not work), or be unable to take ALL formulary alternatives (due to interactions, side effects, etc.), one of which must be Trulance.   In this case other formulary alternatives are available for the member to take. The member has tried lubiprostone (generic Amitiza)  Alternative medications include: Trulance, etc.   Please note: A separate prior authorization request may be required for any of the above alternatives.  PA #/Case ID/Reference #: [id number]

## 2024-01-20 NOTE — Telephone Encounter (Signed)
 Will try to send to [facility] and see if isbrella is covered if not try the amitiza.  Alan

## 2024-01-21 ENCOUNTER — Ambulatory Visit

## 2024-01-21 NOTE — Telephone Encounter (Signed)
 Inbound call from patient stating that she did get the message. Please advise.

## 2024-01-21 NOTE — Telephone Encounter (Signed)
 Left detailed message on voicemail advising patient that she should hear something from [facility] regarding medication.  Advised that if she does not hear from them or if the medication is still not affordable, she should contact our office and we will send amitiza 24 mcg BID.

## 2024-01-24 ENCOUNTER — Telehealth: Payer: Self-pay

## 2024-01-24 NOTE — Telephone Encounter (Signed)
 Left message on machine to call back

## 2024-01-24 NOTE — Telephone Encounter (Signed)
 Pharmacy Patient Advocate Encounter   Received notification from Physician's Office that prior authorization for Ibsrela  50MG  tablets is required/requested.   Insurance verification completed.   The patient is insured through [facility] .   Per test claim:  Previously denied. See encounter from 01-18-2024

## 2024-01-25 ENCOUNTER — Ambulatory Visit
Admission: RE | Admit: 2024-01-25 | Discharge: 2024-01-25 | Disposition: A | Source: Ambulatory Visit | Attending: Physician Assistant | Admitting: Physician Assistant

## 2024-01-25 DIAGNOSIS — Z1231 Encounter for screening mammogram for malignant neoplasm of breast: Secondary | ICD-10-CM

## 2024-01-25 MED ORDER — LUBIPROSTONE 24 MCG PO CAPS: 24.0000 ug | ORAL_CAPSULE | Freq: Two times a day (BID) | ORAL | 3 refills | Status: DC

## 2024-01-25 NOTE — Addendum Note (Signed)
 Addended by: ANITRA ODETTA CROME on: 01/25/2024 12:15 PM   Modules accepted: Orders

## 2024-01-25 NOTE — Telephone Encounter (Signed)
 I spoke with the pt and she agrees to try Amitiza. I have sent to her pharmacy. She will call back with an update

## 2024-01-25 NOTE — Telephone Encounter (Signed)
 Left detailed message on voicemail advising patient that she should hear something from [facility] regarding medication.  Advised that if she does not hear from them or if the medication is still not affordable, she should contact our office and we will send amitiza 24 mcg BID.

## 2024-01-27 ENCOUNTER — Telehealth: Payer: Self-pay

## 2024-01-27 ENCOUNTER — Other Ambulatory Visit (HOSPITAL_COMMUNITY): Payer: Self-pay

## 2024-01-27 MED ORDER — TRULANCE 3 MG PO TABS: 3.0000 mg | ORAL_TABLET | Freq: Every day | ORAL | 0 refills | Status: DC

## 2024-01-27 NOTE — Telephone Encounter (Signed)
 Pharmacy Patient Advocate Encounter   Received notification from Pt Calls Messages that prior authorization for Trulance 3MG  tablets is required/requested.   Insurance verification completed.   The patient is insured through [facility].   Per test claim: PA required; PA submitted to above mentioned insurance via Latent Key/confirmation #/EOC [id number] Status is pending

## 2024-01-27 NOTE — Telephone Encounter (Signed)
 Pharmacy Patient Advocate Encounter   Received notification from Patient Pharmacy that prior authorization for Lubiprostone 24MCG capsules is required/requested.   Insurance verification completed.   The patient is insured through [facility].   Per test claim: xxx

## 2024-01-27 NOTE — Telephone Encounter (Signed)
 Yvonne Bell did well for the patient however according to the Isbrela prior authorization she would need to try Trulance prior to approval.  Will see if we can get Trulance approved but the patient says if the medication is too expensive she is fine with retrying the Amitiza which is generic and more covered on her plan

## 2024-01-28 ENCOUNTER — Other Ambulatory Visit (HOSPITAL_COMMUNITY): Payer: Self-pay

## 2024-01-28 NOTE — Telephone Encounter (Signed)
 Patient called back stating the pharmacy did not approve Trulance. Called pharmacy to discuss & it was approved, they are just out of stock. Soonest shipment will be on Monday, unless we change pharmacies. Patient made aware & is okay with waiting until Monday. Advised her to call back with any further concerns/questions.

## 2024-01-28 NOTE — Telephone Encounter (Signed)
Patient returning call. Please advise, thank you

## 2024-01-28 NOTE — Telephone Encounter (Signed)
 Pharmacy Patient Advocate Encounter  Received notification from [facility] that Prior Authorization for Trulance 3MG  tablets has been APPROVED from 01-27-2024 to 01-26-2025. Ran test claim, Copay is $25.00 for 90 day supply. This test claim was processed through [facility]- copay amounts may vary at other pharmacies due to pharmacy/plan contracts, or as the patient moves through the different stages of their insurance plan.   PA #/Case ID/Reference #: [id number]

## 2024-01-28 NOTE — Telephone Encounter (Signed)
 Called and spoke with patient. Patient states that she has not picked up any prescriptions due to authorization being needed. I informed patient that Trulance 3 mg daily has been approved and prescription was sent to her pharmacy yesterday. I provided patient with the approval number to give to the pharmacy. Patient has been advised to try Trulance for a few weeks and if no improvement to call and let us  know so that we can re-submit PA for Ibsrela . Patient verbalized understanding and had no concerns at the end of the call.

## 2024-01-28 NOTE — Telephone Encounter (Signed)
 Per 9/29 TE patient started Amitiza 24 mcg BID. RX was sent by Odetta, RN.   I called and left patient a detailed vm asking her to clarify if she did start Amitiza or if she wanted to try Trulance which is what her insurance requires her to try before considering Ibsrela  approval.

## 2024-03-13 ENCOUNTER — Other Ambulatory Visit (HOSPITAL_BASED_OUTPATIENT_CLINIC_OR_DEPARTMENT_OTHER): Payer: Self-pay

## 2024-03-14 ENCOUNTER — Other Ambulatory Visit: Payer: Self-pay | Admitting: Adult Health Nurse Practitioner

## 2024-03-14 ENCOUNTER — Other Ambulatory Visit (HOSPITAL_BASED_OUTPATIENT_CLINIC_OR_DEPARTMENT_OTHER): Payer: Self-pay

## 2024-03-14 DIAGNOSIS — N951 Menopausal and female climacteric states: Secondary | ICD-10-CM

## 2024-04-03 ENCOUNTER — Other Ambulatory Visit (HOSPITAL_BASED_OUTPATIENT_CLINIC_OR_DEPARTMENT_OTHER): Payer: Self-pay

## 2024-04-05 ENCOUNTER — Other Ambulatory Visit: Payer: Self-pay | Admitting: Adult Health Nurse Practitioner

## 2024-04-05 ENCOUNTER — Other Ambulatory Visit (HOSPITAL_BASED_OUTPATIENT_CLINIC_OR_DEPARTMENT_OTHER): Payer: Self-pay

## 2024-04-05 DIAGNOSIS — N951 Menopausal and female climacteric states: Secondary | ICD-10-CM

## 2024-05-07 ENCOUNTER — Other Ambulatory Visit: Payer: Self-pay | Admitting: Physician Assistant

## 2024-05-22 ENCOUNTER — Other Ambulatory Visit (HOSPITAL_BASED_OUTPATIENT_CLINIC_OR_DEPARTMENT_OTHER): Payer: Self-pay

## 2024-05-22 ENCOUNTER — Other Ambulatory Visit (HOSPITAL_COMMUNITY): Payer: Self-pay

## 2024-05-22 ENCOUNTER — Encounter (HOSPITAL_BASED_OUTPATIENT_CLINIC_OR_DEPARTMENT_OTHER): Payer: Self-pay

## 2024-05-29 ENCOUNTER — Other Ambulatory Visit (HOSPITAL_COMMUNITY): Payer: Self-pay
